# Patient Record
Sex: Female | Born: 1975
Health system: Southern US, Community
[De-identification: ages and names within clinical notes are randomized; demographics above are authoritative.]

## PROBLEM LIST (undated history)

## (undated) DIAGNOSIS — E119 Type 2 diabetes mellitus without complications: Secondary | ICD-10-CM

## (undated) DIAGNOSIS — F419 Anxiety disorder, unspecified: Secondary | ICD-10-CM

## (undated) DIAGNOSIS — F32A Depression, unspecified: Secondary | ICD-10-CM

## (undated) DIAGNOSIS — I10 Essential (primary) hypertension: Secondary | ICD-10-CM

## (undated) DIAGNOSIS — T7840XA Allergy, unspecified, initial encounter: Secondary | ICD-10-CM

## (undated) HISTORY — DX: Anxiety disorder, unspecified: F41.9

## (undated) HISTORY — DX: Essential (primary) hypertension: I10

## (undated) HISTORY — DX: Depression, unspecified: F32.A

## (undated) HISTORY — DX: Type 2 diabetes mellitus without complications: E11.9

## (undated) HISTORY — DX: Allergy, unspecified, initial encounter: T78.40XA

---

## 1998-01-23 ENCOUNTER — Other Ambulatory Visit: Admission: RE | Admit: 1998-01-23 | Discharge: 1998-01-23 | Payer: Self-pay | Admitting: Obstetrics and Gynecology

## 1999-02-16 ENCOUNTER — Other Ambulatory Visit: Admission: RE | Admit: 1999-02-16 | Discharge: 1999-02-16 | Payer: Self-pay | Admitting: *Deleted

## 2004-08-07 ENCOUNTER — Other Ambulatory Visit: Admission: RE | Admit: 2004-08-07 | Discharge: 2004-08-07 | Payer: Self-pay | Admitting: *Deleted

## 2005-11-22 ENCOUNTER — Other Ambulatory Visit: Admission: RE | Admit: 2005-11-22 | Discharge: 2005-11-22 | Payer: Self-pay | Admitting: Gynecology

## 2007-02-18 ENCOUNTER — Other Ambulatory Visit: Admission: RE | Admit: 2007-02-18 | Discharge: 2007-02-18 | Payer: Self-pay | Admitting: Family Medicine

## 2008-08-19 ENCOUNTER — Other Ambulatory Visit: Admission: RE | Admit: 2008-08-19 | Discharge: 2008-08-19 | Payer: Self-pay | Admitting: Gynecology

## 2008-08-19 ENCOUNTER — Encounter: Payer: Self-pay | Admitting: Women's Health

## 2008-08-19 ENCOUNTER — Ambulatory Visit: Payer: Self-pay | Admitting: Women's Health

## 2008-10-04 ENCOUNTER — Ambulatory Visit: Payer: Self-pay | Admitting: Gynecology

## 2008-10-04 ENCOUNTER — Encounter: Payer: Self-pay | Admitting: Gynecology

## 2009-02-02 ENCOUNTER — Ambulatory Visit: Payer: Self-pay | Admitting: Women's Health

## 2009-03-20 ENCOUNTER — Ambulatory Visit: Payer: Self-pay | Admitting: Gynecology

## 2009-03-20 ENCOUNTER — Other Ambulatory Visit: Admission: RE | Admit: 2009-03-20 | Discharge: 2009-03-20 | Payer: Self-pay | Admitting: Gynecology

## 2009-03-30 ENCOUNTER — Ambulatory Visit (HOSPITAL_COMMUNITY): Admission: RE | Admit: 2009-03-30 | Discharge: 2009-03-30 | Payer: Self-pay | Admitting: Gynecology

## 2009-04-04 ENCOUNTER — Ambulatory Visit: Payer: Self-pay | Admitting: Gynecology

## 2009-04-28 ENCOUNTER — Ambulatory Visit: Payer: Self-pay | Admitting: Gynecology

## 2009-05-04 ENCOUNTER — Ambulatory Visit: Payer: Self-pay | Admitting: Gynecology

## 2009-05-11 ENCOUNTER — Ambulatory Visit: Payer: Self-pay | Admitting: Gynecology

## 2009-10-18 ENCOUNTER — Encounter: Admission: RE | Admit: 2009-10-18 | Discharge: 2009-10-18 | Payer: Self-pay | Admitting: Obstetrics and Gynecology

## 2009-11-29 ENCOUNTER — Ambulatory Visit: Payer: Self-pay | Admitting: Obstetrics and Gynecology

## 2009-11-29 ENCOUNTER — Inpatient Hospital Stay (HOSPITAL_COMMUNITY): Admission: AD | Admit: 2009-11-29 | Discharge: 2009-11-29 | Payer: Self-pay | Admitting: Obstetrics and Gynecology

## 2009-12-27 ENCOUNTER — Inpatient Hospital Stay (HOSPITAL_COMMUNITY): Admission: RE | Admit: 2009-12-27 | Discharge: 2009-12-30 | Payer: Self-pay | Admitting: Obstetrics & Gynecology

## 2009-12-27 ENCOUNTER — Encounter (INDEPENDENT_AMBULATORY_CARE_PROVIDER_SITE_OTHER): Payer: Self-pay | Admitting: Obstetrics and Gynecology

## 2010-04-22 ENCOUNTER — Encounter: Payer: Self-pay | Admitting: Gastroenterology

## 2010-06-14 LAB — BASIC METABOLIC PANEL WITH GFR
BUN: 5 mg/dL — ABNORMAL LOW (ref 6–23)
CO2: 21 meq/L (ref 19–32)
Calcium: 8.9 mg/dL (ref 8.4–10.5)
Chloride: 107 meq/L (ref 96–112)
Creatinine, Ser: 0.55 mg/dL (ref 0.4–1.2)
GFR calc non Af Amer: >60 mL/min
Glucose, Bld: 105 mg/dL — ABNORMAL HIGH (ref 70–99)
Potassium: 3.7 meq/L (ref 3.5–5.1)
Sodium: 136 meq/L (ref 135–145)

## 2010-06-14 LAB — GLUCOSE, CAPILLARY
Glucose-Capillary: 116 mg/dL — ABNORMAL HIGH (ref 70–99)
Glucose-Capillary: 93 mg/dL (ref 70–99)

## 2010-06-14 LAB — CBC
Hemoglobin: 10.1 g/dL — ABNORMAL LOW (ref 12.0–15.0)
Hemoglobin: 12.5 g/dL (ref 12.0–15.0)
MCH: 30.3 pg (ref 26.0–34.0)
MCHC: 34.8 g/dL (ref 30.0–36.0)
Platelets: 179 10*3/uL (ref 150–400)
Platelets: 217 10*3/uL (ref 150–400)
RBC: 3.23 MIL/uL — ABNORMAL LOW (ref 3.87–5.11)
RBC: 4.13 MIL/uL (ref 3.87–5.11)
RDW: 14.6 % (ref 11.5–15.5)

## 2010-06-14 LAB — SURGICAL PCR SCREEN: Staphylococcus aureus: NEGATIVE

## 2010-07-20 LAB — HM HEPATITIS C SCREENING LAB: HM Hepatitis Screen: NEGATIVE

## 2010-07-20 LAB — HM HIV SCREENING LAB: HM HIV Screening: NEGATIVE

## 2012-03-25 IMAGING — US US OB LIMITED
1 series · 14 of 22 positions shown · non-contrast
Comparison: none

OBSTETRICAL ULTRASOUND:
 This ultrasound exam was performed in the [HOSPITAL] Ultrasound Department.  The OB US report was generated in the AS system, and faxed to the ordering physician.  This report is also available in [HOSPITAL]?s AccessANYware and in [REDACTED] PACS.

[Series 1: us fetal bpp w/o nonstress · non-contrast · 22 acquisitions, 14 frames shown]
[im 1/22]
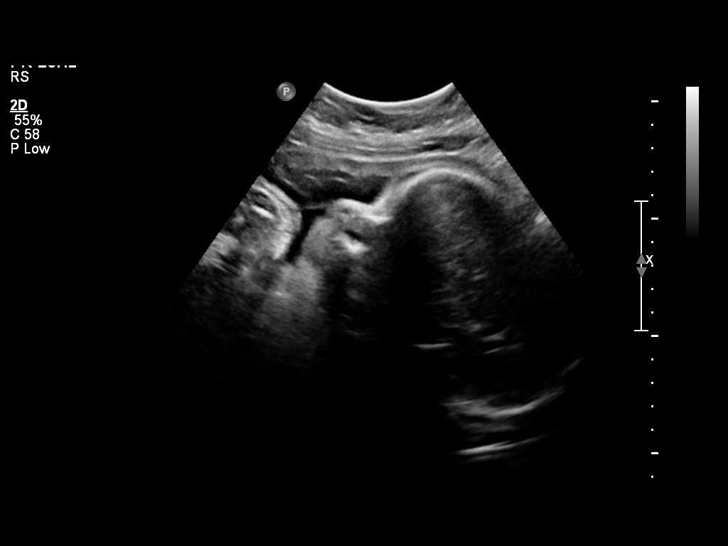
[im 3/22]
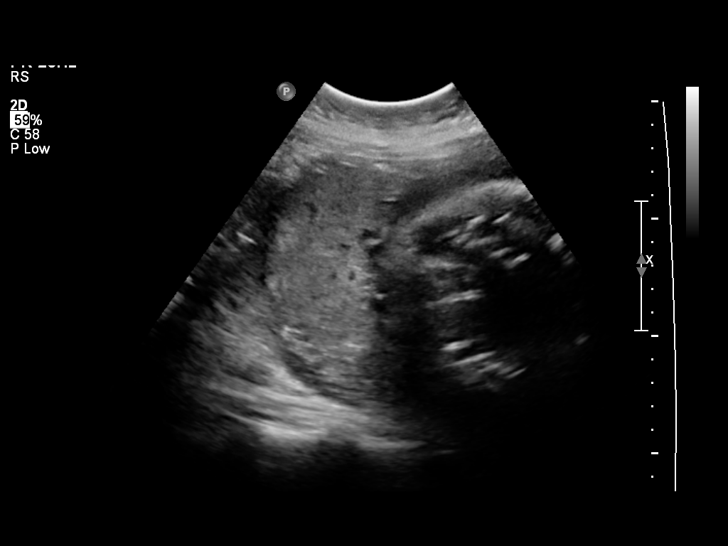
[im 4/22]
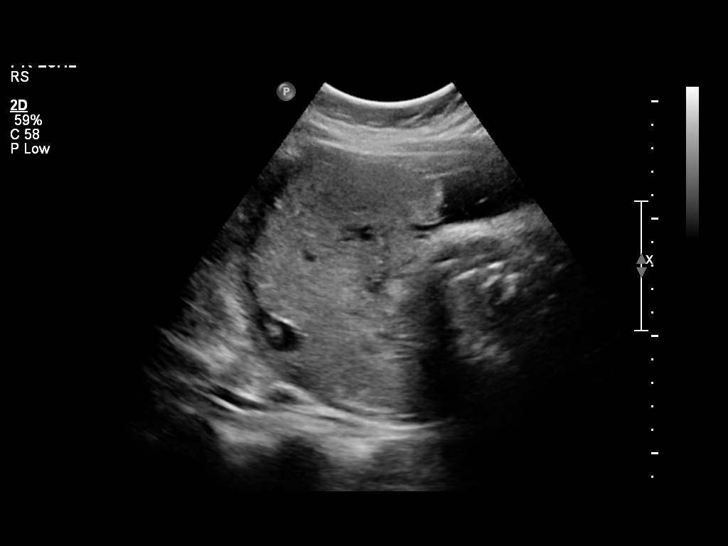
[im 6/22]
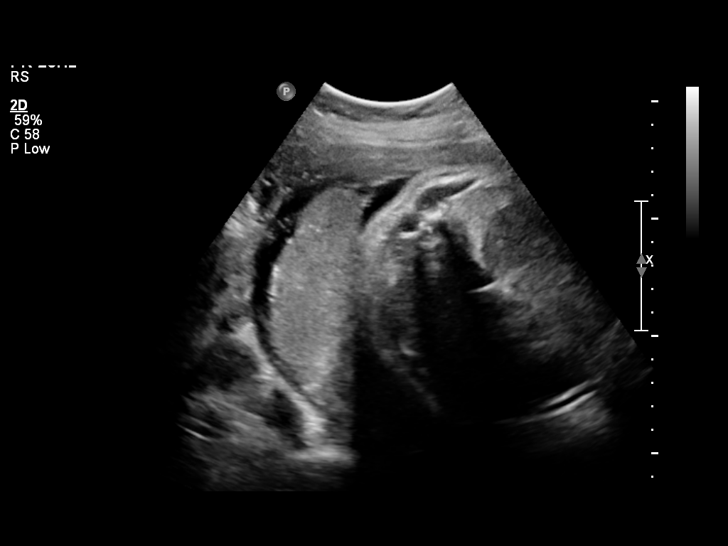
[im 8/22]
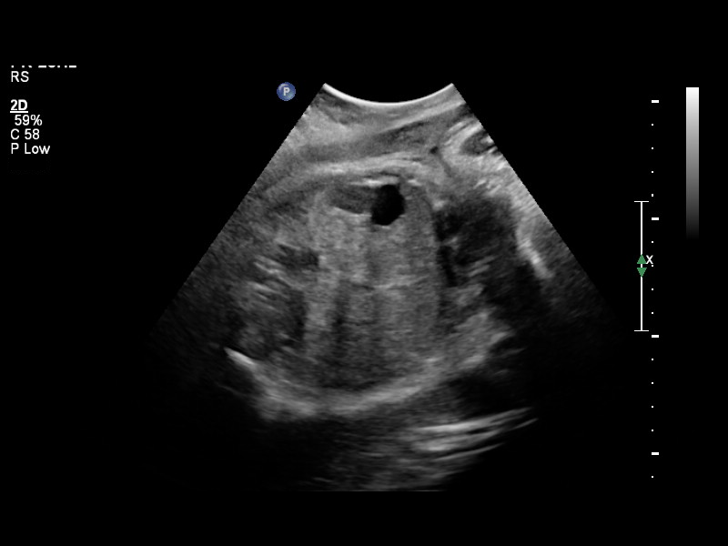
[im 9/22]
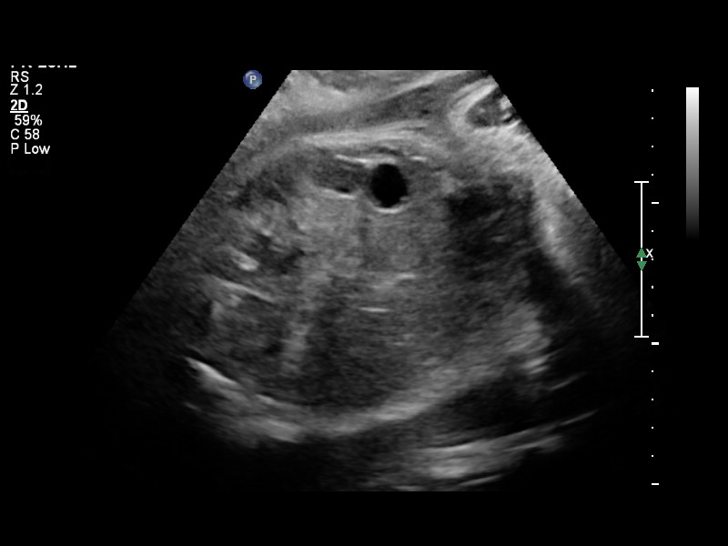
[im 11/22]
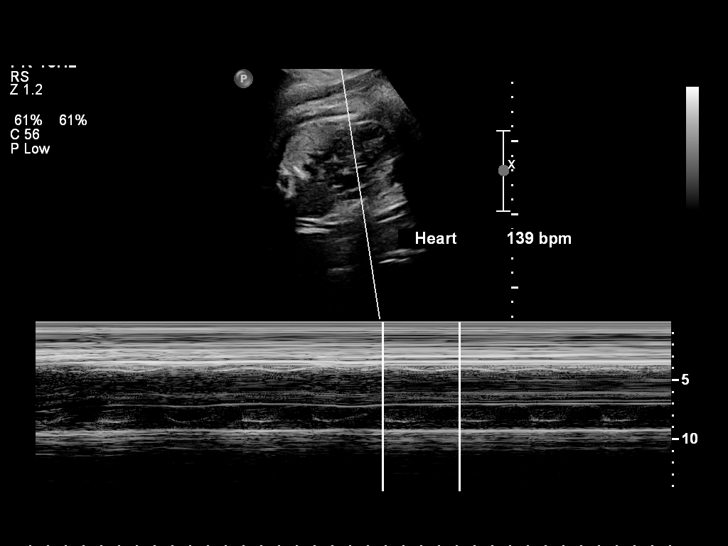
[im 12/22]
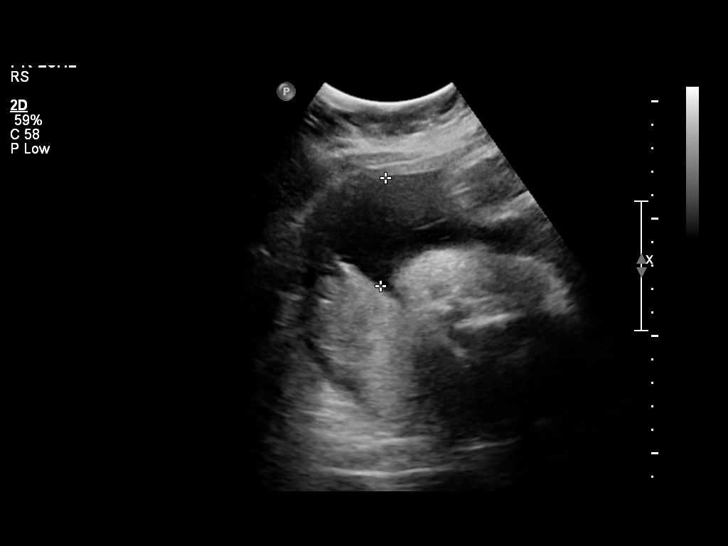
[im 14/22]
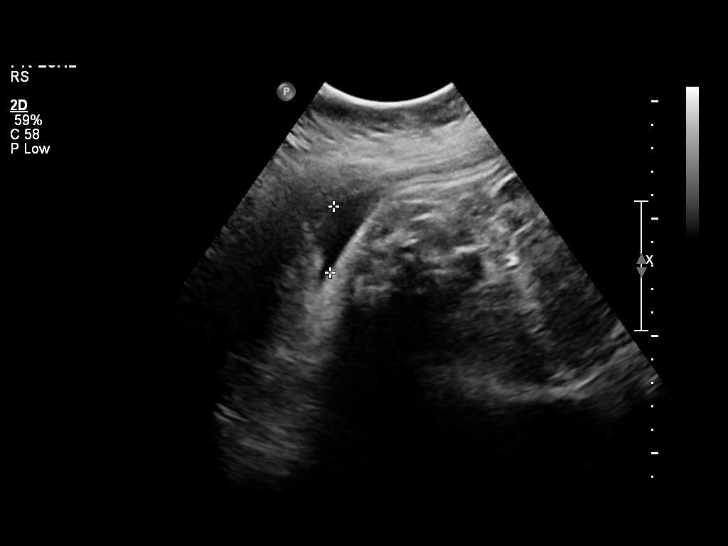
[im 15/22]
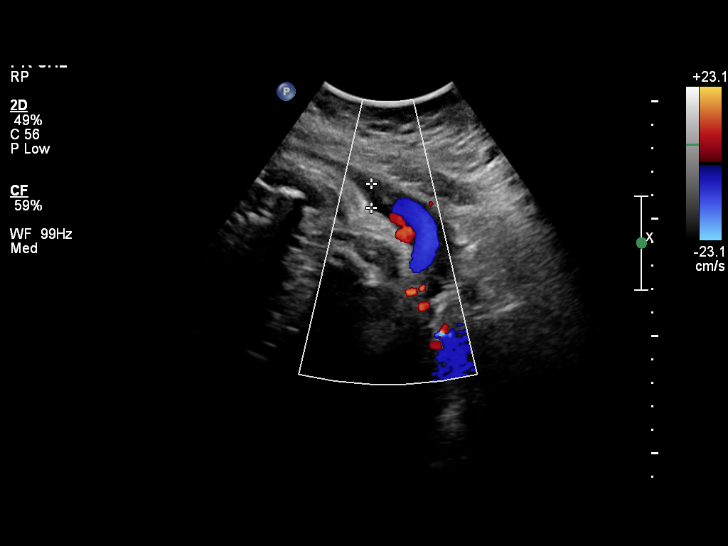
[im 17/22]
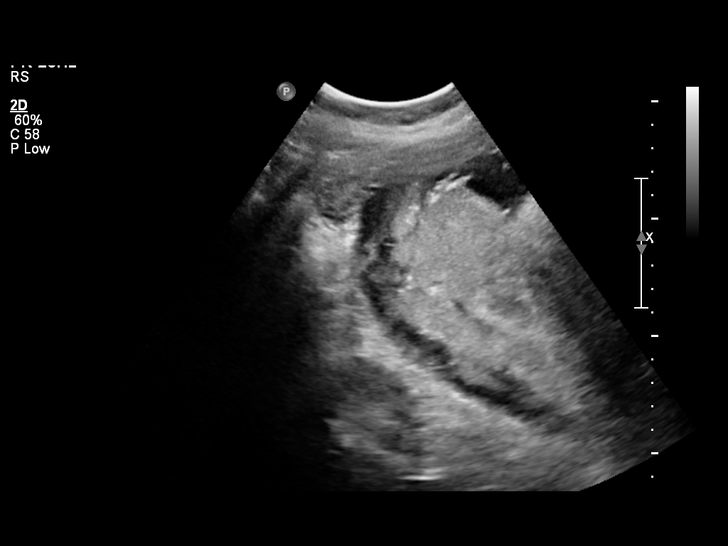
[im 19/22]
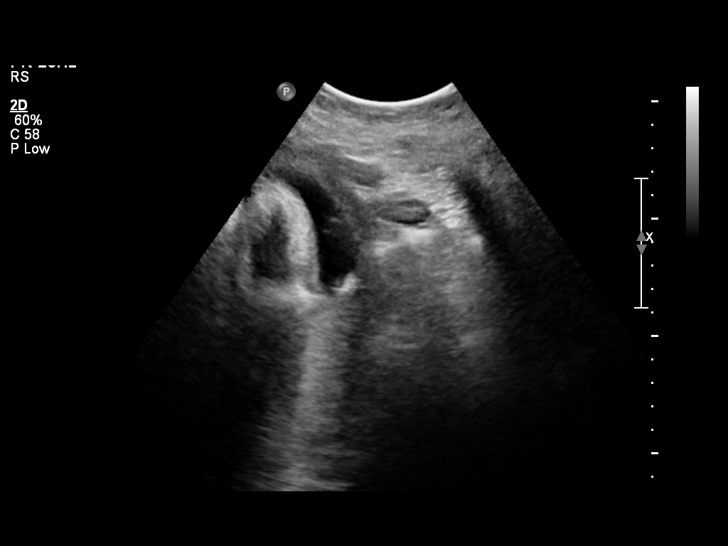
[im 20/22]
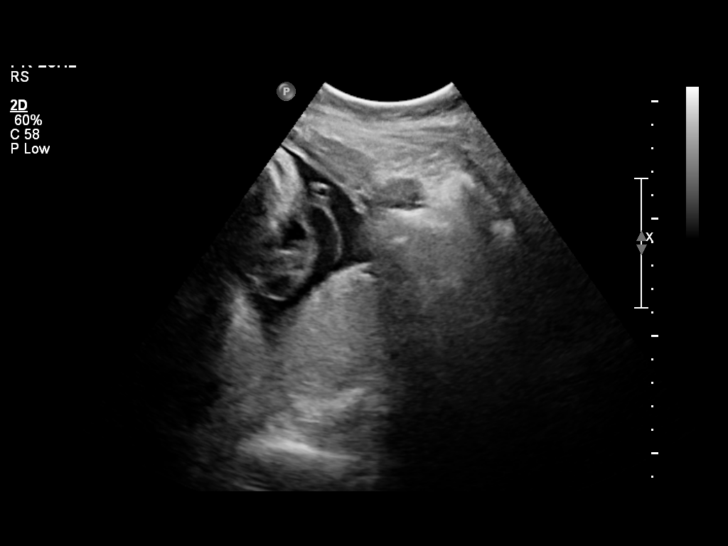
[im 22/22]
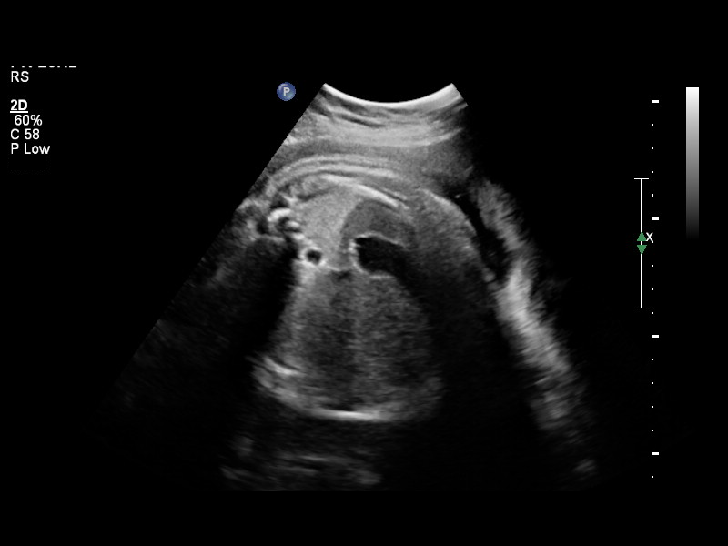

[14 of 22 positions shown; findings below may reference images not displayed]

IMPRESSION: See AS Obstetric US report.

## 2013-08-06 ENCOUNTER — Other Ambulatory Visit (HOSPITAL_COMMUNITY)
Admission: RE | Admit: 2013-08-06 | Discharge: 2013-08-06 | Disposition: A | Discharge status: Home / Self Care | Payer: 59 | Source: Ambulatory Visit | Attending: Family Medicine | Admitting: Family Medicine

## 2013-08-06 ENCOUNTER — Other Ambulatory Visit: Payer: Self-pay | Admitting: Family Medicine

## 2013-08-06 DIAGNOSIS — Z124 Encounter for screening for malignant neoplasm of cervix: Secondary | ICD-10-CM | POA: Insufficient documentation

## 2018-03-12 ENCOUNTER — Other Ambulatory Visit (HOSPITAL_COMMUNITY)
Admission: RE | Admit: 2018-03-12 | Discharge: 2018-03-12 | Disposition: A | Discharge status: Home / Self Care | Payer: BLUE CROSS/BLUE SHIELD | Source: Ambulatory Visit | Attending: Family Medicine | Admitting: Family Medicine

## 2018-03-12 ENCOUNTER — Other Ambulatory Visit: Payer: Self-pay | Admitting: Family Medicine

## 2018-03-12 DIAGNOSIS — N930 Postcoital and contact bleeding: Secondary | ICD-10-CM | POA: Diagnosis not present

## 2018-03-12 DIAGNOSIS — F329 Major depressive disorder, single episode, unspecified: Secondary | ICD-10-CM | POA: Diagnosis not present

## 2018-03-12 DIAGNOSIS — Z124 Encounter for screening for malignant neoplasm of cervix: Secondary | ICD-10-CM | POA: Diagnosis not present

## 2018-03-17 LAB — CYTOLOGY - PAP
Diagnosis: NEGATIVE
HPV (WINDOPATH): NOT DETECTED

## 2019-05-27 ENCOUNTER — Ambulatory Visit: Payer: PRIVATE HEALTH INSURANCE | Attending: Family

## 2019-05-27 DIAGNOSIS — Z23 Encounter for immunization: Secondary | ICD-10-CM | POA: Insufficient documentation

## 2019-05-27 NOTE — Progress Notes (Signed)
   Covid-19 Vaccination Clinic  Name:  Jessica Li    MRN: [id number] DOB: January 02, 1976  05/27/2019  [name] was observed post Covid-19 immunization for 15 minutes without incidence. She was provided with Vaccine Information Sheet and instruction to access the V-Safe system.   [name] was instructed to call 911 with any severe reactions post vaccine: Marland Kitchen Difficulty breathing  . Swelling of your face and throat  . A fast heartbeat  . A bad rash all over your body  . Dizziness and weakness    Immunizations Administered    Name Date Dose VIS Date Route   Moderna COVID-19 Vaccine 05/27/2019  1:20 PM 0.5 mL 03/02/2019 Intramuscular   Manufacturer: Moderna   Lot: [id number]   NDC: 48688-520-74

## 2019-06-29 ENCOUNTER — Ambulatory Visit: Payer: PRIVATE HEALTH INSURANCE | Attending: Family

## 2019-06-29 DIAGNOSIS — Z23 Encounter for immunization: Secondary | ICD-10-CM

## 2019-06-29 NOTE — Progress Notes (Signed)
   Covid-19 Vaccination Clinic  Name:  Jessica Li    MRN: [id number] DOB: [birth year]  06/29/2019  [name] was observed post Covid-19 immunization for 15 minutes without incident. She was provided with Vaccine Information Sheet and instruction to access the V-Safe system.   [name] was instructed to call 911 with any severe reactions post vaccine: Marland Kitchen Difficulty breathing  . Swelling of face and throat  . A fast heartbeat  . A bad rash all over body  . Dizziness and weakness   Immunizations Administered    Name Date Dose VIS Date Route   Moderna COVID-19 Vaccine 06/29/2019  4:22 PM 0.5 mL 03/02/2019 Intramuscular   Manufacturer: Moderna   Lot: [id number]   NDC: 16384-665-99

## 2020-05-24 DIAGNOSIS — Z1231 Encounter for screening mammogram for malignant neoplasm of breast: Secondary | ICD-10-CM | POA: Diagnosis not present

## 2020-06-13 DIAGNOSIS — R928 Other abnormal and inconclusive findings on diagnostic imaging of breast: Secondary | ICD-10-CM | POA: Diagnosis not present

## 2020-06-13 DIAGNOSIS — N6489 Other specified disorders of breast: Secondary | ICD-10-CM | POA: Diagnosis not present

## 2020-06-13 DIAGNOSIS — R922 Inconclusive mammogram: Secondary | ICD-10-CM | POA: Diagnosis not present

## 2021-01-18 DIAGNOSIS — R6883 Chills (without fever): Secondary | ICD-10-CM | POA: Diagnosis not present

## 2021-01-18 DIAGNOSIS — R051 Acute cough: Secondary | ICD-10-CM | POA: Diagnosis not present

## 2021-01-18 DIAGNOSIS — J069 Acute upper respiratory infection, unspecified: Secondary | ICD-10-CM | POA: Diagnosis not present

## 2021-01-18 DIAGNOSIS — M791 Myalgia, unspecified site: Secondary | ICD-10-CM | POA: Diagnosis not present

## 2021-05-30 DIAGNOSIS — Z1231 Encounter for screening mammogram for malignant neoplasm of breast: Secondary | ICD-10-CM | POA: Diagnosis not present

## 2021-07-17 NOTE — Progress Notes (Signed)
? ?New Patient Office Visit ? ?Subjective   ? ?Patient ID: Jessica Li, female    DOB: [birth year]  Age: 46 y.o. MRN: [id number] ? ?CC:  ?Chief Complaint  ?Patient presents with  ? Establish Care  ?  Np. Est care. DM management. Pt is fasting.   ? ? ?HPI ?Jessica Li presents for new patient visit to establish care.  Introduced to Publishing rights manager role and practice setting.  All questions answered.  Discussed provider/patient relationship and expectations. ? ?She was seeing a provider in [place] who saw patients without insurance. She now has health insurance and would like to establish closer.  ? ?She feels like she has had fluid in her left ear for several years. Was told that she could have tubes placed in her ear if her symptoms were continuing. She would like a referral to ENT. She endorses some congestion right now and seasonal allergies.  ? ?She has a history of diabetes. She has been close to running out of her medications so she has been stretching them for the last 3 months. She has not been checking her blood sugars recently as she knows they will be high. She denies chest pain, shortness of breath, blurry vision, and numbness or tingling in her feet.  ? ?She has a history of anxiety. She was on wellbutrin and prozac in the past, however she wanted to get off the medication. She has not had panic attacks in the last 10 years. She does not feel like it interferes with every day life. She gets irritated easily, even with small things. She denies trouble sleeping. She is not interested in starting medication today if she doesn't have to.  ? ? ?  07/19/2021  ?  9:00 AM  ?Depression screen PHQ 2/9  ?Decreased Interest 1  ?Down, Depressed, Hopeless 1  ?PHQ - 2 Score 2  ?Altered sleeping 1  ?Tired, decreased energy 2  ?Change in appetite 2  ?Feeling bad or failure about yourself  0  ?Trouble concentrating 2  ?Moving slowly or fidgety/restless 0  ?Suicidal thoughts 0  ?PHQ-9 Score 9  ?Difficult doing  work/chores Not difficult at all  ? ? ?  07/19/2021  ?  9:00 AM  ?GAD 7 : Generalized Anxiety Score  ?Nervous, Anxious, on Edge 3  ?Control/stop worrying 3  ?Worry too much - different things 3  ?Trouble relaxing 1  ?Restless 0  ?Easily annoyed or irritable 3  ?Afraid - awful might happen 0  ?Total GAD 7 Score 13  ?Anxiety Difficulty Not difficult at all  ? ? ?Outpatient Encounter Medications as of 07/19/2021  ?Medication Sig  ? glimepiride (AMARYL) 4 MG tablet Take 1 tablet (4 mg total) by mouth 2 (two) times daily.  ? lisinopril (ZESTRIL) 10 MG tablet Take 1 tablet (10 mg total) by mouth daily.  ? metFORMIN (GLUCOPHAGE) 1000 MG tablet Take 1 tablet (1,000 mg total) by mouth 2 (two) times daily with a meal.  ? pioglitazone (ACTOS) 15 MG tablet Take 1 tablet (15 mg total) by mouth daily.  ? [DISCONTINUED] glimepiride (AMARYL) 4 MG tablet Take 4 mg by mouth 2 (two) times daily.  ? [DISCONTINUED] lisinopril (ZESTRIL) 10 MG tablet Take 10 mg by mouth daily.  ? [DISCONTINUED] metFORMIN (GLUCOPHAGE) 1000 MG tablet Take 500 mg by mouth in the morning and at bedtime.  ? [DISCONTINUED] pioglitazone (ACTOS) 15 MG tablet take 1 pill daily  ? ?No facility-administered encounter medications on file as of 07/19/2021.  ? ? ?  Past Medical History:  ?Diagnosis Date  ? Diabetes mellitus without complication (HCC)   ? Hypertension   ? ? ?Past Surgical History:  ?Procedure Laterality Date  ? CESAREAN SECTION    ? ? ?Family History  ?Problem Relation Age of Onset  ? Heart disease Mother   ? Heart disease Father   ? Cancer Sister   ?     melanoma  ? ? ?Social History  ? ?Socioeconomic History  ? Marital status: Married  ?  Spouse name: Not on file  ? Number of children: Not on file  ? Years of education: Not on file  ? Highest education level: Not on file  ?Occupational History  ? Not on file  ?Tobacco Use  ? Smoking status: Never  ? Smokeless tobacco: Never  ?Vaping Use  ? Vaping Use: Never used  ?Substance and Sexual Activity  ? Alcohol  use: Never  ? Drug use: Never  ? Sexual activity: Yes  ?  Birth control/protection: None  ?Other Topics Concern  ? Not on file  ?Social History Narrative  ? Not on file  ? ?Social Determinants of Health  ? ?Financial Resource Strain: Not on file  ?Food Insecurity: Not on file  ?Transportation Needs: Not on file  ?Physical Activity: Not on file  ?Stress: Not on file  ?Social Connections: Not on file  ?Intimate Partner Violence: Not on file  ? ? ?Review of Systems  ?Constitutional: Negative.   ?HENT:  Positive for congestion and ear pain (left ear fluid). Negative for sore throat.   ?Eyes: Negative.   ?Respiratory: Negative.    ?Cardiovascular:  Positive for palpitations (with anxiety). Negative for chest pain.  ?Gastrointestinal:  Positive for constipation (takes miralax as needed). Negative for abdominal pain, diarrhea, nausea and vomiting.  ?Genitourinary: Negative.   ?Musculoskeletal: Negative.   ?Skin: Negative.   ?Neurological:  Positive for headaches (once in a while). Negative for dizziness.  ?Endo/Heme/Allergies:  Positive for environmental allergies.  ?Psychiatric/Behavioral:  Negative for depression. The patient is nervous/anxious.   ?  ?Objective   ? ?BP 136/88 (BP Location: Right Arm, Cuff Size: Large)   Pulse (!) 104   Temp 97.9 ?F (36.6 ?C) (Temporal)   Ht 5' 3.5" (1.613 m)   Wt 179 lb 9.6 oz (81.5 kg)   LMP 06/11/2021 (Exact Date)   SpO2 99%   BMI 31.32 kg/m?  ? ?Physical Exam ?Vitals and nursing note reviewed.  ?Constitutional:   ?   General: She is not in acute distress. ?   Appearance: Normal appearance.  ?HENT:  ?   Head: Normocephalic and atraumatic.  ?   Right Ear: Tympanic membrane, ear canal and external ear normal.  ?   Left Ear: External ear normal. There is impacted cerumen.  ?   Nose: Nose normal.  ?   Mouth/Throat:  ?   Mouth: Mucous membranes are moist.  ?   Pharynx: Oropharynx is clear.  ?Eyes:  ?   Conjunctiva/sclera: Conjunctivae normal.  ?Cardiovascular:  ?   Rate and Rhythm:  Normal rate and regular rhythm.  ?   Pulses: Normal pulses.  ?   Heart sounds: Normal heart sounds.  ?Pulmonary:  ?   Effort: Pulmonary effort is normal.  ?   Breath sounds: Normal breath sounds.  ?Abdominal:  ?   General: Bowel sounds are normal.  ?   Palpations: Abdomen is soft.  ?   Tenderness: There is no abdominal tenderness.  ?Musculoskeletal:     ?  General: Normal range of motion.  ?   Cervical back: Normal range of motion. No tenderness.  ?   Right lower leg: No edema.  ?   Left lower leg: No edema.  ?Lymphadenopathy:  ?   Cervical: No cervical adenopathy.  ?Skin: ?   General: Skin is warm and dry.  ?Neurological:  ?   General: No focal deficit present.  ?   Mental Status: She is alert and oriented to person, place, and time.  ?   Cranial Nerves: No cranial nerve deficit.  ?   Coordination: Coordination normal.  ?   Gait: Gait normal.  ?Psychiatric:     ?   Mood and Affect: Mood normal.     ?   Behavior: Behavior normal.     ?   Thought Content: Thought content normal.     ?   Judgment: Judgment normal.  ? ? ?Last CBC ?Lab Results  ?Component Value Date  ? WBC 10.5 12/28/2009  ? HGB 10.1 DELTA CHECK NOTED REPEATED TO VERIFY (L) 12/28/2009  ? HCT 28.9 (L) 12/28/2009  ? MCV 89.4 12/28/2009  ? MCH 31.1 12/28/2009  ? RDW 14.4 12/28/2009  ? PLT 179 12/28/2009  ? ?Last metabolic panel ?Lab Results  ?Component Value Date  ? GLUCOSE 105 (H) 12/26/2009  ? NA 136 12/26/2009  ? K 3.7 12/26/2009  ? CL 107 12/26/2009  ? CO2 21 12/26/2009  ? BUN 5 (L) 12/26/2009  ? CREATININE 0.55 12/26/2009  ? CALCIUM 8.9 12/26/2009  ? ?  ? ?Assessment & Plan:  ? ?Problem List Items Addressed This Visit   ? ?  ? Cardiovascular and Mediastinum  ? Hypertension associated with diabetes (HCC)  ?  Chronic, elevated.  BP recheck on today was 136/88.  She has been running low on her lisinopril, will refill lisinopril 10 mg daily.  Check CMP, CBC, lipid panel.  Follow-up in 3 months. ? ?  ?  ? Relevant Medications  ? glimepiride (AMARYL) 4 MG  tablet  ? lisinopril (ZESTRIL) 10 MG tablet  ? metFORMIN (GLUCOPHAGE) 1000 MG tablet  ? pioglitazone (ACTOS) 15 MG tablet  ? Other Relevant Orders  ? CBC with Differential/Platelet  ? Comprehensive metabolic panel

## 2021-07-19 ENCOUNTER — Encounter: Payer: Self-pay | Admitting: Nurse Practitioner

## 2021-07-19 ENCOUNTER — Ambulatory Visit: Payer: BC Managed Care – PPO | Admitting: Nurse Practitioner

## 2021-07-19 VITALS — BP 136/88 | HR 104 | Temp 97.9°F | Ht 63.5 in | Wt 179.6 lb

## 2021-07-19 DIAGNOSIS — F419 Anxiety disorder, unspecified: Secondary | ICD-10-CM | POA: Diagnosis not present

## 2021-07-19 DIAGNOSIS — Z23 Encounter for immunization: Secondary | ICD-10-CM | POA: Diagnosis not present

## 2021-07-19 DIAGNOSIS — H9202 Otalgia, left ear: Secondary | ICD-10-CM | POA: Insufficient documentation

## 2021-07-19 DIAGNOSIS — I152 Hypertension secondary to endocrine disorders: Secondary | ICD-10-CM | POA: Diagnosis not present

## 2021-07-19 DIAGNOSIS — E1159 Type 2 diabetes mellitus with other circulatory complications: Secondary | ICD-10-CM | POA: Diagnosis not present

## 2021-07-19 DIAGNOSIS — E119 Type 2 diabetes mellitus without complications: Secondary | ICD-10-CM

## 2021-07-19 LAB — MICROALBUMIN / CREATININE URINE RATIO
Creatinine,U: 28.5 mg/dL
Microalb Creat Ratio: 3.4 mg/g (ref 0.0–30.0)
Microalb, Ur: 1 mg/dL (ref 0.0–1.9)

## 2021-07-19 LAB — CBC WITH DIFFERENTIAL/PLATELET
Basophils Absolute: 0 10*3/uL (ref 0.0–0.1)
Basophils Relative: 0.5 % (ref 0.0–3.0)
Eosinophils Absolute: 0.1 10*3/uL (ref 0.0–0.7)
Eosinophils Relative: 1.6 % (ref 0.0–5.0)
HCT: 33.8 % — ABNORMAL LOW (ref 36.0–46.0)
Hemoglobin: 10.7 g/dL — ABNORMAL LOW (ref 12.0–15.0)
Lymphocytes Relative: 22.9 % (ref 12.0–46.0)
Lymphs Abs: 1.6 10*3/uL (ref 0.7–4.0)
MCHC: 31.6 g/dL (ref 30.0–36.0)
MCV: 72.1 fl — ABNORMAL LOW (ref 78.0–100.0)
Monocytes Absolute: 0.5 10*3/uL (ref 0.1–1.0)
Monocytes Relative: 7 % (ref 3.0–12.0)
Neutro Abs: 4.7 10*3/uL (ref 1.4–7.7)
Neutrophils Relative %: 68 % (ref 43.0–77.0)
Platelets: 522 10*3/uL — ABNORMAL HIGH (ref 150.0–400.0)
RBC: 4.69 Mil/uL (ref 3.87–5.11)
RDW: 16.7 % — ABNORMAL HIGH (ref 11.5–15.5)
WBC: 7 10*3/uL (ref 4.0–10.5)

## 2021-07-19 LAB — LIPID PANEL
Cholesterol: 262 mg/dL — ABNORMAL HIGH (ref 0–200)
HDL: 55.4 mg/dL (ref >39.00)
NonHDL: 206.67
Total CHOL/HDL Ratio: 5
Triglycerides: 238 mg/dL — ABNORMAL HIGH (ref 0.0–149.0)
VLDL: 47.6 mg/dL — ABNORMAL HIGH (ref 0.0–40.0)

## 2021-07-19 LAB — TSH: TSH: 2.15 u[IU]/mL (ref 0.35–5.50)

## 2021-07-19 LAB — COMPREHENSIVE METABOLIC PANEL
ALT: 13 U/L (ref 0–35)
AST: 13 U/L (ref 0–37)
Albumin: 4 g/dL (ref 3.5–5.2)
Alkaline Phosphatase: 80 U/L (ref 39–117)
BUN: 12 mg/dL (ref 6–23)
CO2: 25 mEq/L (ref 19–32)
Calcium: 9.2 mg/dL (ref 8.4–10.5)
Chloride: 96 mEq/L (ref 96–112)
Creatinine, Ser: 0.59 mg/dL (ref 0.40–1.20)
GFR: 108.18 mL/min (ref >60.00)
Glucose, Bld: 375 mg/dL — ABNORMAL HIGH (ref 70–99)
Potassium: 4.5 mEq/L (ref 3.5–5.1)
Sodium: 131 mEq/L — ABNORMAL LOW (ref 135–145)
Total Bilirubin: 0.4 mg/dL (ref 0.2–1.2)
Total Protein: 7.4 g/dL (ref 6.0–8.3)

## 2021-07-19 LAB — HEMOGLOBIN A1C: Hgb A1c MFr Bld: 13.9 % — ABNORMAL HIGH (ref 4.6–6.5)

## 2021-07-19 LAB — LDL CHOLESTEROL, DIRECT: Direct LDL: 188 mg/dL

## 2021-07-19 MED ORDER — GLIMEPIRIDE 4 MG PO TABS: 4.0000 mg | ORAL_TABLET | Freq: Two times a day (BID) | ORAL | 1 refills | Status: DC

## 2021-07-19 MED ORDER — METFORMIN HCL 1000 MG PO TABS: 1000.0000 mg | ORAL_TABLET | Freq: Two times a day (BID) | ORAL | 1 refills | Status: DC

## 2021-07-19 MED ORDER — LISINOPRIL 10 MG PO TABS: 10.0000 mg | ORAL_TABLET | Freq: Every day | ORAL | 1 refills | Status: DC

## 2021-07-19 MED ORDER — PIOGLITAZONE HCL 15 MG PO TABS: 15.0000 mg | ORAL_TABLET | Freq: Every day | ORAL | 1 refills | Status: DC

## 2021-07-19 NOTE — Assessment & Plan Note (Signed)
She has a history of diabetes, she has been running low on her medications and has been stretching them out for the last 3 months.  She stopped checking her blood sugars because she knew that they would be elevated.  Will restart glimepiride 4 mg twice a day, lisinopril 10 mg daily, metformin 2000 mg twice a day, and Actos 15 mg daily.  We will check A1c, CMP, CBC, urine microalbumin.  Foot exam within normal limits.  Follow-up in 3 months. ?

## 2021-07-19 NOTE — Assessment & Plan Note (Deleted)
She has a history of diabetes, she has been running low on her medications and has been stretching them out for the last 3 months.  She stopped checking her blood sugars because she knew that they would be elevated.  Will restart glimepiride 4 mg twice a day, lisinopril 10 mg daily, metformin 2000 mg twice a day, and Actos 15 mg daily.  We will check A1c, CMP, CBC, urine microalbumin.  Foot exam within normal limits.  Follow-up in 3 months. ?

## 2021-07-19 NOTE — Assessment & Plan Note (Signed)
She has been having ongoing discomfort in her left ear for several years.  She states that she was told that she could get tubes put in her ear, however she declined this.  Today she does have some earwax in her left ear, she declines irrigation today.  She can use Debrox eardrops over-the-counter to help with earwax removal.  Referral placed to ENT per patient request ?

## 2021-07-19 NOTE — Assessment & Plan Note (Signed)
Chronic, elevated.  BP recheck on today was 136/88.  She has been running low on her lisinopril, will refill lisinopril 10 mg daily.  Check CMP, CBC, lipid panel.  Follow-up in 3 months. ?

## 2021-07-19 NOTE — Patient Instructions (Signed)
It was great to see you! ? ?We are checking your labs today and will let you know the results via mychart/phone.  ? ?I have sent refills to your pharmacy.  ? ?Let's follow-up in 3 months, sooner if you have concerns. ? ?If a referral was placed today, you will be contacted for an appointment. Please note that routine referrals can sometimes take up to 3-4 weeks to process. Please call our office if you haven't heard anything after this time frame. ? ?Take care, ? ?Alastor Kneale, NP ? ?

## 2021-07-19 NOTE — Telephone Encounter (Signed)
Responded in other mychart message

## 2021-07-19 NOTE — Assessment & Plan Note (Signed)
Chronic, stable.  She states that she was on medication in the past including Wellbutrin and Prozac.  She is not interested in restarting medication today.  Her PHQ-9 is a 9 and her GAD-7 is a 13, however she states that her symptoms are overall stable.  Discussed nonpharmacologic ways to help with anxiety including exercise, meditation, deep breathing.  Also discussed the option of therapy.  Follow-up in 6 months or sooner with concerns. ?

## 2021-10-19 ENCOUNTER — Ambulatory Visit: Payer: BC Managed Care – PPO | Admitting: Nurse Practitioner

## 2022-01-01 NOTE — Progress Notes (Unsigned)
   Established Patient Office Visit  Subjective   Patient ID: Jessica Li, female    DOB: 01-May-1975  Age: 46 y.o. MRN: [id number]  No chief complaint on file.   HPI  KEARIA YIN is here to follow-up on diabetes and hyperlipidemia.   DIABETES  Hypoglycemic episodes:{Blank single:19197::"yes","no"} Polydipsia/polyuria: {Blank single:19197::"yes","no"} Visual disturbance: {Blank single:19197::"yes","no"} Chest pain: {Blank single:19197::"yes","no"} Paresthesias: {Blank single:19197::"yes","no"} Glucose Monitoring: {Blank single:19197::"yes","no"}  Accucheck frequency: {Blank single:19197::"Not Checking","Daily","BID","TID"}  Fasting glucose:  Post prandial:  Evening:  Before meals: Taking Insulin?: no Blood Pressure Monitoring: {Blank single:19197::"not checking","rarely","daily","weekly","monthly","a few times a day","a few times a week","a few times a month"} Retinal Examination: {Blank single:19197::"Up to Date","Not up to Date"} Foot Exam: {Blank single:19197::"Up to Date","Not up to Date"} Diabetic Education: {Blank single:19197::"Completed","Not Completed"} Pneumovax: {Blank single:19197::"Up to Date","Not up to Date","unknown"} Influenza: {Blank single:19197::"Up to Date","Not up to Date","unknown"} Aspirin: {Blank single:19197::"yes","no"}   {History (Optional):23778}  ROS    Objective:     There were no vitals taken for this visit. {Vitals History (Optional):23777}  Physical Exam   No results found for any visits on 01/02/22.  {Labs (Optional):23779}  The 10-year ASCVD risk score (Arnett DK, et al., 2019) is: 2.9%    Assessment & Plan:   Problem List Items Addressed This Visit   None   No follow-ups on file.    Charyl Dancer, NP

## 2022-01-02 ENCOUNTER — Encounter: Payer: Self-pay | Admitting: Nurse Practitioner

## 2022-01-02 ENCOUNTER — Ambulatory Visit: Payer: BC Managed Care – PPO | Admitting: Nurse Practitioner

## 2022-01-02 VITALS — BP 128/90 | HR 108 | Temp 97.6°F | Wt 182.8 lb

## 2022-01-02 DIAGNOSIS — E119 Type 2 diabetes mellitus without complications: Secondary | ICD-10-CM

## 2022-01-02 DIAGNOSIS — Z1211 Encounter for screening for malignant neoplasm of colon: Secondary | ICD-10-CM

## 2022-01-02 DIAGNOSIS — Z1212 Encounter for screening for malignant neoplasm of rectum: Secondary | ICD-10-CM

## 2022-01-02 DIAGNOSIS — E1159 Type 2 diabetes mellitus with other circulatory complications: Secondary | ICD-10-CM

## 2022-01-02 DIAGNOSIS — F419 Anxiety disorder, unspecified: Secondary | ICD-10-CM | POA: Diagnosis not present

## 2022-01-02 DIAGNOSIS — I152 Hypertension secondary to endocrine disorders: Secondary | ICD-10-CM | POA: Diagnosis not present

## 2022-01-02 LAB — POCT GLYCOSYLATED HEMOGLOBIN (HGB A1C)
HbA1c POC (<> result, manual entry): 12.2 % (ref 4.0–5.6)
HbA1c, POC (controlled diabetic range): 12.2 % — AB (ref 0.0–7.0)
Hemoglobin A1C: 12.2 % — AB (ref 4.0–5.6)

## 2022-01-02 MED ORDER — EMPAGLIFLOZIN 10 MG PO TABS: 10.0000 mg | ORAL_TABLET | Freq: Every day | ORAL | 0 refills | Status: DC

## 2022-01-02 MED ORDER — LISINOPRIL 10 MG PO TABS: 10.0000 mg | ORAL_TABLET | Freq: Every day | ORAL | 1 refills | Status: DC

## 2022-01-02 MED ORDER — EMPAGLIFLOZIN 25 MG PO TABS: 25.0000 mg | ORAL_TABLET | Freq: Every day | ORAL | 0 refills | Status: DC

## 2022-01-02 MED ORDER — BUPROPION HCL ER (XL) 150 MG PO TB24: 150.0000 mg | ORAL_TABLET | Freq: Every day | ORAL | 0 refills | Status: DC

## 2022-01-02 MED ORDER — METFORMIN HCL 1000 MG PO TABS: 1000.0000 mg | ORAL_TABLET | Freq: Two times a day (BID) | ORAL | 1 refills | Status: DC

## 2022-01-02 MED ORDER — PIOGLITAZONE HCL 15 MG PO TABS: 15.0000 mg | ORAL_TABLET | Freq: Every day | ORAL | 1 refills | Status: DC

## 2022-01-02 NOTE — Assessment & Plan Note (Signed)
Chronic, not controlled. A1c today is 12.2%. Will have her stop the glimepride as she has been on this for several years and it may not be offering good control anymore. Start jardiance 10mg  daily for 30 days then increase to 25mg  daily. Continue checking blood sugars daily. Recommend yearly eye exam. Foot exam up to date. Declines flu and pneumonia. Continue exercising daily. Follow-up in 3 months.

## 2022-01-02 NOTE — Assessment & Plan Note (Signed)
Chronic, worsened in the last few months. She states that she had 2 deaths in her family recently and would like to re-start medication. Will start her on Wellbutrin XL 150mg  daily. Her PHQ-9 today is a 9 and her GAD 7 is an 28. She denies SI/HI. Follow-up in 3 months or sooner with concerns.

## 2022-01-02 NOTE — Patient Instructions (Addendum)
It was great to see you!  Stop the glimepiride and start jardiance 10mg  daily for 30 days. Then increase to jardiance 25mg  daily.   Start Wellbutrin 1 tablet daily.   Let's follow-up in 3 months, sooner if you have concerns.  If a referral was placed today, you will be contacted for an appointment. Please note that routine referrals can sometimes take up to 3-4 weeks to process. Please call our office if you haven't heard anything after this time frame.  Take care,  Vance Peper, NP

## 2022-01-02 NOTE — Assessment & Plan Note (Signed)
Chronic, stable. Continue lisinopril 10mg  daily. Refill sent to the pharmacy. Follow-up in 3 months.

## 2022-01-24 DIAGNOSIS — E876 Hypokalemia: Secondary | ICD-10-CM | POA: Diagnosis not present

## 2022-01-24 DIAGNOSIS — R110 Nausea: Secondary | ICD-10-CM | POA: Diagnosis not present

## 2022-01-24 DIAGNOSIS — D72829 Elevated white blood cell count, unspecified: Secondary | ICD-10-CM | POA: Diagnosis not present

## 2022-01-24 DIAGNOSIS — I10 Essential (primary) hypertension: Secondary | ICD-10-CM | POA: Diagnosis not present

## 2022-01-24 DIAGNOSIS — R079 Chest pain, unspecified: Secondary | ICD-10-CM | POA: Diagnosis not present

## 2022-01-24 DIAGNOSIS — R112 Nausea with vomiting, unspecified: Secondary | ICD-10-CM | POA: Diagnosis not present

## 2022-01-24 DIAGNOSIS — R739 Hyperglycemia, unspecified: Secondary | ICD-10-CM | POA: Diagnosis not present

## 2022-01-24 DIAGNOSIS — K2970 Gastritis, unspecified, without bleeding: Secondary | ICD-10-CM | POA: Diagnosis not present

## 2022-01-24 DIAGNOSIS — Z79899 Other long term (current) drug therapy: Secondary | ICD-10-CM | POA: Diagnosis not present

## 2022-01-24 DIAGNOSIS — N179 Acute kidney failure, unspecified: Secondary | ICD-10-CM | POA: Diagnosis not present

## 2022-01-24 DIAGNOSIS — T50995A Adverse effect of other drugs, medicaments and biological substances, initial encounter: Secondary | ICD-10-CM | POA: Diagnosis not present

## 2022-01-24 DIAGNOSIS — F32A Depression, unspecified: Secondary | ICD-10-CM | POA: Diagnosis not present

## 2022-01-24 DIAGNOSIS — R109 Unspecified abdominal pain: Secondary | ICD-10-CM | POA: Diagnosis not present

## 2022-01-24 DIAGNOSIS — R1110 Vomiting, unspecified: Secondary | ICD-10-CM | POA: Diagnosis not present

## 2022-01-24 DIAGNOSIS — E1110 Type 2 diabetes mellitus with ketoacidosis without coma: Secondary | ICD-10-CM | POA: Diagnosis not present

## 2022-01-24 DIAGNOSIS — E860 Dehydration: Secondary | ICD-10-CM | POA: Diagnosis not present

## 2022-01-24 DIAGNOSIS — R0789 Other chest pain: Secondary | ICD-10-CM | POA: Diagnosis not present

## 2022-01-24 DIAGNOSIS — Z7984 Long term (current) use of oral hypoglycemic drugs: Secondary | ICD-10-CM | POA: Diagnosis not present

## 2022-01-24 DIAGNOSIS — K209 Esophagitis, unspecified without bleeding: Secondary | ICD-10-CM | POA: Diagnosis not present

## 2022-01-26 LAB — BASIC METABOLIC PANEL
CO2: 10 — AB (ref 13–22)
Chloride: 109 — AB (ref 99–108)
Creatinine: 0.4 — AB (ref <=1.1)
Glucose: 198
Potassium: 3.3 mEq/L — AB (ref 3.5–5.1)
Sodium: 133 — AB (ref 137–147)

## 2022-01-26 LAB — COMPREHENSIVE METABOLIC PANEL: Calcium: 7.7 — AB (ref 8.7–10.7)

## 2022-01-26 LAB — CBC AND DIFFERENTIAL
HCT: 34 — AB (ref 36–46)
Hemoglobin: 11.7 — AB (ref 12.0–16.0)
Platelets: 299 10*3/uL (ref 150–400)
WBC: 9.4

## 2022-01-26 LAB — CBC: RBC: 4 (ref 3.87–5.11)

## 2022-01-28 ENCOUNTER — Telehealth: Payer: Self-pay

## 2022-01-28 NOTE — Telephone Encounter (Signed)
Transition Care Management Follow-up Telephone Call Date of discharge and from where: 01/26/22 [facility]. Trying to request records. Patient states she was admitted for DKA. How have you been since you were released from the hospital? I feel like I have been hit be a truck. Any questions or concerns? No  Items Reviewed: Did the pt receive and understand the discharge instructions provided? Yes  Medications obtained and verified? Yes  Other? No  Any new allergies since your discharge? No  Dietary orders reviewed? No Do you have support at home? Yes   Home Care and Equipment/Supplies: Were home health services ordered? not applicable If so, what is the name of the agency? N/a  Has the agency set up a time to come to the patient's home? not applicable Were any new equipment or medical supplies ordered?  No What is the name of the medical supply agency? N/a Were you able to get the supplies/equipment? not applicable Do you have any questions related to the use of the equipment or supplies? No  Functional Questionnaire: (I = Independent and D = Dependent) ADLs: I  Bathing/Dressing- I  Meal Prep- I  Eating- I  Maintaining continence- I  Transferring/Ambulation- I  Managing Meds- I  Follow up appointments reviewed:  PCP Hospital f/u appt confirmed? Yes  Scheduled to see Vance Peper on 01/30/22 @ 11:20am. [facility] f/u appt confirmed? No  Scheduled to see n/a on n/a @ n/a. Pt states she was advised to be seen by Endocrinology Are transportation arrangements needed? No  If their condition worsens, is the pt aware to call PCP or go to the Emergency Dept.? Yes Was the patient provided with contact information for the PCP's office or ED? Yes Was to pt encouraged to call back with questions or concerns? Yes  Angeline Slim, RN, BSN RN Clinical Supervisor [facility] [employer]

## 2022-01-28 NOTE — Telephone Encounter (Signed)
Transition Care Management Unsuccessful Follow-up Telephone Call  Date of discharge and from where:  [place] 01/26/2022  Attempts:  1st Attempt  Reason for unsuccessful TCM follow-up call:  Left voice message Juanda Crumble, [place] Direct Dial [phone]

## 2022-01-28 NOTE — Telephone Encounter (Signed)
Records request faxed 

## 2022-01-28 NOTE — Telephone Encounter (Signed)
Noted  

## 2022-01-29 DIAGNOSIS — I10 Essential (primary) hypertension: Secondary | ICD-10-CM | POA: Diagnosis not present

## 2022-01-29 DIAGNOSIS — Z23 Encounter for immunization: Secondary | ICD-10-CM | POA: Diagnosis not present

## 2022-01-29 DIAGNOSIS — K219 Gastro-esophageal reflux disease without esophagitis: Secondary | ICD-10-CM | POA: Diagnosis not present

## 2022-01-29 DIAGNOSIS — E876 Hypokalemia: Secondary | ICD-10-CM | POA: Diagnosis not present

## 2022-01-29 DIAGNOSIS — B9620 Unspecified Escherichia coli [E. coli] as the cause of diseases classified elsewhere: Secondary | ICD-10-CM | POA: Diagnosis not present

## 2022-01-29 DIAGNOSIS — R112 Nausea with vomiting, unspecified: Secondary | ICD-10-CM | POA: Diagnosis not present

## 2022-01-29 DIAGNOSIS — Z79899 Other long term (current) drug therapy: Secondary | ICD-10-CM | POA: Diagnosis not present

## 2022-01-29 DIAGNOSIS — E860 Dehydration: Secondary | ICD-10-CM | POA: Diagnosis not present

## 2022-01-29 DIAGNOSIS — R52 Pain, unspecified: Secondary | ICD-10-CM | POA: Diagnosis not present

## 2022-01-29 DIAGNOSIS — E1110 Type 2 diabetes mellitus with ketoacidosis without coma: Secondary | ICD-10-CM | POA: Diagnosis not present

## 2022-01-29 DIAGNOSIS — F32A Depression, unspecified: Secondary | ICD-10-CM | POA: Diagnosis not present

## 2022-01-29 DIAGNOSIS — Z880 Allergy status to penicillin: Secondary | ICD-10-CM | POA: Diagnosis not present

## 2022-01-29 DIAGNOSIS — E872 Acidosis, unspecified: Secondary | ICD-10-CM | POA: Diagnosis not present

## 2022-01-29 DIAGNOSIS — Z1152 Encounter for screening for COVID-19: Secondary | ICD-10-CM | POA: Diagnosis not present

## 2022-01-29 DIAGNOSIS — Z794 Long term (current) use of insulin: Secondary | ICD-10-CM | POA: Diagnosis not present

## 2022-01-29 DIAGNOSIS — E1310 Other specified diabetes mellitus with ketoacidosis without coma: Secondary | ICD-10-CM | POA: Diagnosis not present

## 2022-01-29 DIAGNOSIS — N390 Urinary tract infection, site not specified: Secondary | ICD-10-CM | POA: Diagnosis not present

## 2022-01-30 ENCOUNTER — Inpatient Hospital Stay: Payer: BC Managed Care – PPO | Admitting: Nurse Practitioner

## 2022-01-30 ENCOUNTER — Encounter: Payer: Self-pay | Admitting: Nurse Practitioner

## 2022-01-30 DIAGNOSIS — E119 Type 2 diabetes mellitus without complications: Secondary | ICD-10-CM

## 2022-01-31 ENCOUNTER — Telehealth: Payer: Self-pay | Admitting: Nurse Practitioner

## 2022-01-31 NOTE — Telephone Encounter (Signed)
Pt has just been release from [facility] today 01/31/22 for Ketoacidosis from [place]. She is wanting to schedule a hosp f/up with Ander Purpura, please advise pt @ [phone]

## 2022-02-01 NOTE — Telephone Encounter (Signed)
Requested records electronically via Mychart

## 2022-02-01 NOTE — Telephone Encounter (Signed)
Transition Care Management Follow-up Telephone Call Date of discharge and from where: [facility] 01/31/2022 How have you been since you were released from the hospital? better Any questions or concerns? No  Items Reviewed: Did the pt receive and understand the discharge instructions provided? Yes  Medications obtained and verified? Yes  Other? No  Any new allergies since your discharge? No  Dietary orders reviewed? Yes Do you have support at home? Yes   Home Care and Equipment/Supplies: Were home health services ordered? no If so, what is the name of the agency? N/a  Has the agency set up a time to come to the patient's home? not applicable Were any new equipment or medical supplies ordered?  No What is the name of the medical supply agency? N/a Were you able to get the supplies/equipment? not applicable Do you have any questions related to the use of the equipment or supplies? No  Functional Questionnaire: (I = Independent and D = Dependent) ADLs: I  Bathing/Dressing- I  Meal Prep- I  Eating- I  Maintaining continence- I  Transferring/Ambulation- I  Managing Meds- I  Follow up appointments reviewed:  PCP Hospital f/u appt confirmed? Yes  Scheduled to see Vance Peper on 02/07/2022 @ 8:40. [facility] f/u appt confirmed? No  Are transportation arrangements needed? No  If their condition worsens, is the pt aware to call PCP or go to the Emergency Dept.? Yes Was the patient provided with contact information for the PCP's office or ED? Yes Was to pt encouraged to call back with questions or concerns? Yes Juanda Crumble, LPN [place] Direct Dial [phone]

## 2022-02-06 NOTE — Progress Notes (Unsigned)
   Established Patient Office Visit  Subjective   Patient ID: Jessica Li, female    DOB: 05-22-1975  Age: 46 y.o. MRN: [id number]  No chief complaint on file.   HPI  Jessica Li is here to follow-up on DKA hospitalizations from 01/24/22 to 01/26/22 and from 01/29/22 to 01/31/22. She was started on jardiance and started noticing abdominal pain and stopped this medication after about 7 days. The symptoms continued and she went to the ER and was found to be in DKA. She was admitted and given IV fluids and IV insulin. She was discharged on semglee isulin 18 units BID and Humalog.   Transition of Care Hospital Follow up.   Hospital/Facility: [facility]  D/C Physician: Dr. Heron Sabins D/C Date: 01/31/22  Records Requested: 02/01/22 Records Received: 02/06/22 Records Reviewed: 02/07/22  Diagnoses on Discharge: DKA, dehydration, hypokalemia  Date of interactive Contact within 48 hours of discharge: 02/01/22 Contact was through: phone  Date of 7 day or 14 day face-to-face visit:    within 7 days  Outpatient Encounter Medications as of 02/07/2022  Medication Sig   buPROPion (WELLBUTRIN XL) 150 MG 24 hr tablet Take 1 tablet (150 mg total) by mouth daily.   empagliflozin (JARDIANCE) 10 MG TABS tablet Take 1 tablet (10 mg total) by mouth daily before breakfast. Take for 30 days, then increase to 25mg  daily   empagliflozin (JARDIANCE) 25 MG TABS tablet Take 1 tablet (25 mg total) by mouth daily before breakfast.   lisinopril (ZESTRIL) 10 MG tablet Take 1 tablet (10 mg total) by mouth daily.   metFORMIN (GLUCOPHAGE) 1000 MG tablet Take 1 tablet (1,000 mg total) by mouth 2 (two) times daily with a meal.   pioglitazone (ACTOS) 15 MG tablet Take 1 tablet (15 mg total) by mouth daily.   No facility-administered encounter medications on file as of 02/07/2022.    Diagnostic Tests Reviewed/Disposition: Reviewed, sent to be scanned in records  Consults: None in hospital, needs endocrine  consult  Discharge Instructions  Disease/illness Education: Educated today on chronic illness and nutrition  Home Health/Community Services Discussions/Referrals: None  Establishment or re-establishment of referral orders for community resources: None  Discussion with other health care providers: Reviewed notes, will place referral to endocrinology  Assessment and Support of treatment regimen adherence: Reviewed with patient today  Appointments Coordinated with:  Endocrinology  Education for self-management, independent living, and ADLs:  Reviewed with patient  {History (Optional):23778}  ROS    Objective:     There were no vitals taken for this visit. {Vitals History (Optional):23777}  Physical Exam   No results found for any visits on 02/07/22.  {Labs (Optional):23779}  The 10-year ASCVD risk score (Arnett DK, et al., 2019) is: 2.6%    Assessment & Plan:   Problem List Items Addressed This Visit   None   No follow-ups on file.    2020, NP

## 2022-02-07 ENCOUNTER — Encounter: Payer: Self-pay | Admitting: Nurse Practitioner

## 2022-02-07 ENCOUNTER — Ambulatory Visit (INDEPENDENT_AMBULATORY_CARE_PROVIDER_SITE_OTHER): Payer: BC Managed Care – PPO | Admitting: Nurse Practitioner

## 2022-02-07 VITALS — BP 120/80 | HR 87 | Temp 97.3°F | Ht 63.0 in | Wt 174.8 lb

## 2022-02-07 DIAGNOSIS — R600 Localized edema: Secondary | ICD-10-CM

## 2022-02-07 DIAGNOSIS — E1159 Type 2 diabetes mellitus with other circulatory complications: Secondary | ICD-10-CM | POA: Diagnosis not present

## 2022-02-07 DIAGNOSIS — R200 Anesthesia of skin: Secondary | ICD-10-CM

## 2022-02-07 DIAGNOSIS — E1165 Type 2 diabetes mellitus with hyperglycemia: Secondary | ICD-10-CM | POA: Diagnosis not present

## 2022-02-07 DIAGNOSIS — I152 Hypertension secondary to endocrine disorders: Secondary | ICD-10-CM

## 2022-02-07 DIAGNOSIS — R202 Paresthesia of skin: Secondary | ICD-10-CM

## 2022-02-07 LAB — COMPREHENSIVE METABOLIC PANEL WITH GFR
ALT: 11 U/L (ref 0–35)
AST: 12 U/L (ref 0–37)
Albumin: 3.6 g/dL (ref 3.5–5.2)
Alkaline Phosphatase: 56 U/L (ref 39–117)
BUN: 11 mg/dL (ref 6–23)
CO2: 32 meq/L (ref 19–32)
Calcium: 8.7 mg/dL (ref 8.4–10.5)
Chloride: 101 meq/L (ref 96–112)
Creatinine, Ser: 0.51 mg/dL (ref 0.40–1.20)
GFR: 111.61 mL/min
Glucose, Bld: 260 mg/dL — ABNORMAL HIGH (ref 70–99)
Potassium: 4.3 meq/L (ref 3.5–5.1)
Sodium: 140 meq/L (ref 135–145)
Total Bilirubin: 0.3 mg/dL (ref 0.2–1.2)
Total Protein: 6.3 g/dL (ref 6.0–8.3)

## 2022-02-07 LAB — LIPID PANEL
Cholesterol: 189 mg/dL (ref 0–200)
HDL: 60.2 mg/dL (ref >39.00)
LDL Cholesterol: 100 mg/dL — ABNORMAL HIGH (ref 0–99)
NonHDL: 129.11
Total CHOL/HDL Ratio: 3
Triglycerides: 147 mg/dL (ref 0.0–149.0)
VLDL: 29.4 mg/dL (ref 0.0–40.0)

## 2022-02-07 LAB — VITAMIN B12: Vitamin B-12: 512 pg/mL (ref 211–911)

## 2022-02-07 MED ORDER — INSULIN GLARGINE-YFGN 100 UNIT/ML ~~LOC~~ SOPN: 20.0000 [IU] | PEN_INJECTOR | Freq: Two times a day (BID) | SUBCUTANEOUS | 1 refills | Status: DC

## 2022-02-07 MED ORDER — METFORMIN HCL 500 MG PO TABS: 500.0000 mg | ORAL_TABLET | Freq: Two times a day (BID) | ORAL | 1 refills | Status: DC

## 2022-02-07 NOTE — Assessment & Plan Note (Signed)
Chronic, stable.  BP today 120/80.  Continue lisinopril 10 mg daily.  Recent labs reviewed.  Her potassium was low when she was taking supplements for this which she finished a few days ago.  We will check BMP today.

## 2022-02-07 NOTE — Assessment & Plan Note (Addendum)
She was recently in the hospital for DKA after starting Jardiance 10 mg daily.  She is now currently taking metformin 500 mg twice a day and Semglee insulin along with NovoLog insulin for meals.  Her blood sugars are still not fully controlled, will increase her Semglee insulin to 20 units twice a day and keep the same sliding scale for her NovoLog insulin.  Offered her the continuous glucose monitor, however she declined.  Recent labs reviewed from hospitalization, including CMP, CBC.  She has a referral that was placed to endocrinology, however she has not heard yet to schedule an appointment.  We will reach out to our referral coordinator to follow-up on this.  Her cholesterol has been elevated in the past, she was wanting to see if she could change things on her own before starting medication.  We will check fasting lipid panel today. Records reviewed from hospitalization. Medication reconciliation completed.

## 2022-02-07 NOTE — Patient Instructions (Addendum)
It was great to see you!  Increase your semglee insulin to 20 units twice a day.   Keep taking the humalog as prescribed.   We are reaching out to our referral coordinator about your referral to endocrine   Let's follow-up in 6-8 weeks, sooner if you have concerns.  If a referral was placed today, you will be contacted for an appointment. Please note that routine referrals can sometimes take up to 3-4 weeks to process. Please call our office if you haven't heard anything after this time frame.  Take care,  Rodman Pickle, NP

## 2022-03-06 ENCOUNTER — Encounter: Payer: Self-pay | Admitting: Nurse Practitioner

## 2022-03-06 ENCOUNTER — Telehealth: Payer: Self-pay | Admitting: Nurse Practitioner

## 2022-03-06 MED ORDER — INSULIN LISPRO (1 UNIT DIAL) 100 UNIT/ML (KWIKPEN): PEN_INJECTOR | SUBCUTANEOUS | 0 refills | Status: DC

## 2022-03-06 MED ORDER — INSULIN GLARGINE-YFGN 100 UNIT/ML ~~LOC~~ SOPN: 20.0000 [IU] | PEN_INJECTOR | Freq: Two times a day (BID) | SUBCUTANEOUS | 1 refills | Status: DC

## 2022-03-06 NOTE — Telephone Encounter (Signed)
Caller Name: [facility] pharmacy Call back phone #: [phone]  Reason for Call: Please call pharmacy to verify prescription sent over. There is some confusion regarding a quantity

## 2022-03-07 NOTE — Telephone Encounter (Signed)
Spoke w/ [facility]- needing verbal to change dispense quantity to 57mL (1 box), they are unable to break the box. Spoke w/ pharmacist- verbal given to 12mL and 1 refill.

## 2022-03-12 NOTE — Patient Instructions (Incomplete)

## 2022-03-14 ENCOUNTER — Ambulatory Visit: Payer: Self-pay | Admitting: Nurse Practitioner

## 2022-03-14 DIAGNOSIS — E1165 Type 2 diabetes mellitus with hyperglycemia: Secondary | ICD-10-CM

## 2022-03-21 ENCOUNTER — Ambulatory Visit: Payer: BC Managed Care – PPO | Admitting: Nurse Practitioner

## 2022-04-04 ENCOUNTER — Encounter: Payer: BC Managed Care – PPO | Admitting: Nurse Practitioner

## 2022-04-24 NOTE — Patient Instructions (Signed)

## 2022-04-25 ENCOUNTER — Ambulatory Visit (INDEPENDENT_AMBULATORY_CARE_PROVIDER_SITE_OTHER): Payer: BC Managed Care – PPO | Admitting: Nurse Practitioner

## 2022-04-25 ENCOUNTER — Encounter: Payer: Self-pay | Admitting: Nurse Practitioner

## 2022-04-25 ENCOUNTER — Telehealth: Payer: Self-pay | Admitting: Nurse Practitioner

## 2022-04-25 VITALS — BP 131/78 | HR 94 | Ht 63.0 in | Wt 182.4 lb

## 2022-04-25 DIAGNOSIS — E1165 Type 2 diabetes mellitus with hyperglycemia: Secondary | ICD-10-CM

## 2022-04-25 LAB — POCT GLYCOSYLATED HEMOGLOBIN (HGB A1C): Hemoglobin A1C: 8.1 % — AB (ref 4.0–5.6)

## 2022-04-25 MED ORDER — INSULIN GLARGINE-YFGN 100 UNIT/ML ~~LOC~~ SOPN: 30.0000 [IU] | PEN_INJECTOR | Freq: Every day | SUBCUTANEOUS | 1 refills | Status: DC

## 2022-04-25 MED ORDER — INSULIN LISPRO (1 UNIT DIAL) 100 UNIT/ML (KWIKPEN): 5.0000 [IU] | PEN_INJECTOR | Freq: Three times a day (TID) | SUBCUTANEOUS | 3 refills | Status: DC

## 2022-04-25 MED ORDER — LANTUS SOLOSTAR 100 UNIT/ML ~~LOC~~ SOPN: 30.0000 [IU] | PEN_INJECTOR | Freq: Every day | SUBCUTANEOUS | 3 refills | Status: DC

## 2022-04-25 NOTE — Telephone Encounter (Signed)
Do you want to cancel this prescription, the pharmacy would like to know what they need to do

## 2022-04-25 NOTE — Telephone Encounter (Signed)
You prescribed Semglee 3 ML- is only 1 pen  Pharmacy wants to know if they can  do 15 ML which is 5 pens since they can't break up a box   Also, if Ins prefers can they do Lantus instead of Semglee?

## 2022-04-25 NOTE — Telephone Encounter (Signed)
I fixed it.  I sent new script for Lantus to the pharmacy.  The patient does not have to pick up the insulin until she needs it.

## 2022-04-25 NOTE — Telephone Encounter (Signed)
Well, I didn't mean to send in a prescription just yet.  She has plenty at home. OOPS

## 2022-04-25 NOTE — Progress Notes (Signed)
Endocrinology Consult Note       04/25/2022, 4:35 PM   Subjective:    Patient ID: Jessica Li, female    DOB: May 02, 1975.  Jessica Li is being seen in consultation for management of currently uncontrolled symptomatic diabetes requested by  Gerre Scull, NP.   Past Medical History:  Diagnosis Date   Diabetes mellitus without complication (HCC)    Hypertension     Past Surgical History:  Procedure Laterality Date   CESAREAN SECTION      Social History   Socioeconomic History   Marital status: Married    Spouse name: Not on file   Number of children: Not on file   Years of education: Not on file   Highest education level: Not on file  Occupational History   Not on file  Tobacco Use   Smoking status: Never   Smokeless tobacco: Never  Vaping Use   Vaping Use: Never used  Substance and Sexual Activity   Alcohol use: Never   Drug use: Never   Sexual activity: Yes    Birth control/protection: None  Other Topics Concern   Not on file  Social History Narrative   Not on file   Social Determinants of Health   Financial Resource Strain: Not on file  Food Insecurity: Not on file  Transportation Needs: Not on file  Physical Activity: Not on file  Stress: Not on file  Social Connections: Not on file    Family History  Problem Relation Age of Onset   Heart disease Mother    Heart disease Father    Cancer Sister        melanoma    Outpatient Encounter Medications as of 04/25/2022  Medication Sig   buPROPion (WELLBUTRIN XL) 150 MG 24 hr tablet Take 1 tablet (150 mg total) by mouth daily.   lisinopril (ZESTRIL) 10 MG tablet Take 1 tablet (10 mg total) by mouth daily.   metFORMIN (GLUCOPHAGE) 500 MG tablet Take 1 tablet (500 mg total) by mouth 2 (two) times daily with a meal.   [DISCONTINUED] insulin glargine-yfgn (SEMGLEE, YFGN,) 100 UNIT/ML Pen Inject 20 Units into the skin 2 (two)  times daily.   [DISCONTINUED] insulin lispro (HUMALOG) 100 UNIT/ML KwikPen Per previous sliding scale   insulin lispro (HUMALOG) 100 UNIT/ML KwikPen Inject 5-8 Units into the skin 3 (three) times daily. Per previous sliding scale   [DISCONTINUED] insulin glargine-yfgn (SEMGLEE, YFGN,) 100 UNIT/ML Pen Inject 30 Units into the skin at bedtime.   No facility-administered encounter medications on file as of 04/25/2022.    ALLERGIES: Allergies  Allergen Reactions   Co-Trimoxazole [Sulfamethoxazole-Trimethoprim] Hives   Empagliflozin Nausea And Vomiting    Caused DKA    VACCINATION STATUS: Immunization History  Administered Date(s) Administered   Moderna Covid-19 Vaccine Bivalent Booster 17yrs & up 03/29/2020   Moderna Sars-Covid-2 Vaccination 05/27/2019, 06/29/2019   Td 07/19/2021    Diabetes She presents for her initial diabetic visit. She has type 2 diabetes mellitus. Onset time: Diagnosed at approx age of 55. Her disease course has been improving. There are no hypoglycemic associated symptoms. There are no hypoglycemic complications. (DKA) Risk factors for coronary artery disease include  diabetes mellitus and hypertension. Current diabetic treatment includes intensive insulin program and oral agent (monotherapy) (Semglee 20 units BID; Humalog 2-4 units TID; Metformin 1000 mg BID). She is compliant with treatment most of the time. Her weight is fluctuating minimally. She is following a generally healthy diet. (She presents today for her consultation with no meter or logs to review.  Her POCT A1c today is 8.1%, improving from last A1c of 12.2%.  She monitors glucose 4-5 times per day, not interested in CGM.  She drinks coffee with splenda and SF creamer and water throughout the day.  She mostly skips breakfast, eats lunch and supper and snacks.  She engages in routine physical activity but admits she could do better.  She is due for eye exam, has never been seen by podiatry in the past.  She  denies any hypoglycemia but does report she typically wakes in the morning with higher readings than when she went to bed.)     Review of systems  Constitutional: + Minimally fluctuating body weight, current Body mass index is 32.31 kg/m., no fatigue, no subjective hyperthermia, no subjective hypothermia Eyes: no blurry vision, no xerophthalmia ENT: no sore throat, no nodules palpated in throat, no dysphagia/odynophagia, no hoarseness Cardiovascular: no chest pain, no shortness of breath, no palpitations, no leg swelling Respiratory: no cough, no shortness of breath Gastrointestinal: no nausea/vomiting/diarrhea Musculoskeletal: no muscle/joint aches Skin: no rashes, no hyperemia Neurological: no tremors, no numbness, no tingling, no dizziness Psychiatric: no depression, no anxiety  Objective:     BP 131/78 (BP Location: Left Arm, Patient Position: Sitting, Cuff Size: Normal)   Pulse 94   Ht 5\' 3"  (1.6 m)   Wt 182 lb 6.4 oz (82.7 kg)   BMI 32.31 kg/m   Wt Readings from Last 3 Encounters:  04/25/22 182 lb 6.4 oz (82.7 kg)  02/07/22 174 lb 12.8 oz (79.3 kg)  01/02/22 182 lb 12.8 oz (82.9 kg)     BP Readings from Last 3 Encounters:  04/25/22 131/78  02/07/22 120/80  01/02/22 (!) 128/90     Physical Exam- Limited  Constitutional:  Body mass index is 32.31 kg/m. , not in acute distress, normal state of mind Eyes:  EOMI, no exophthalmos Neck: Supple Cardiovascular: RRR, no murmurs, rubs, or gallops, no edema Respiratory: Adequate breathing efforts, no crackles, rales, rhonchi, or wheezing Musculoskeletal: no gross deformities, strength intact in all four extremities, no gross restriction of joint movements Skin:  no rashes, no hyperemia Neurological: no tremor with outstretched hands    CMP ( most recent) CMP     Component Value Date/Time   NA 140 02/07/2022 0916   NA 133 (A) 01/26/2022 0000   K 4.3 02/07/2022 0916   CL 101 02/07/2022 0916   CO2 32 02/07/2022  0916   GLUCOSE 260 (H) 02/07/2022 0916   BUN 11 02/07/2022 0916   CREATININE 0.51 02/07/2022 0916   CALCIUM 8.7 02/07/2022 0916   PROT 6.3 02/07/2022 0916   ALBUMIN 3.6 02/07/2022 0916   AST 12 02/07/2022 0916   ALT 11 02/07/2022 0916   ALKPHOS 56 02/07/2022 0916   BILITOT 0.3 02/07/2022 0916   GFRNONAA >60 12/26/2009 1416   GFRAA  12/26/2009 1416    >60        The eGFR has been calculated using the MDRD equation. This calculation has not been validated in all clinical situations. eGFR's persistently <60 mL/min signify possible Chronic Kidney Disease.     Diabetic Labs (most recent): Lab  Results  Component Value Date   HGBA1C 8.1 (A) 04/25/2022   HGBA1C 12.2 (A) 01/02/2022   HGBA1C 12.2 01/02/2022   HGBA1C 12.2 (A) 01/02/2022   MICROALBUR 1.0 07/19/2021     Lipid Panel ( most recent) Lipid Panel     Component Value Date/Time   CHOL 189 02/07/2022 0916   TRIG 147.0 02/07/2022 0916   HDL 60.20 02/07/2022 0916   CHOLHDL 3 02/07/2022 0916   VLDL 29.4 02/07/2022 0916   LDLCALC 100 (H) 02/07/2022 0916   LDLDIRECT 188.0 07/19/2021 0858      Lab Results  Component Value Date   TSH 2.15 07/19/2021           Assessment & Plan:   1) Type 2 diabetes mellitus with hyperglycemia, without long-term current use of insulin ([place])  She presents today for her consultation with no meter or logs to review.  Her POCT A1c today is 8.1%, improving from last A1c of 12.2%.  She monitors glucose 4-5 times per day, not interested in CGM.  She drinks coffee with splenda and SF creamer and water throughout the day.  She mostly skips breakfast, eats lunch and supper and snacks.  She engages in routine physical activity but admits she could do better.  She is due for eye exam, has never been seen by podiatry in the past.  She denies any hypoglycemia but does report she typically wakes in the morning with higher readings than when she went to bed.  - Jessica Li has currently  uncontrolled symptomatic type 2 DM since 47 years of age, with most recent A1c of 8.1 %.   -Recent labs reviewed.  - I had a long discussion with her about the progressive nature of diabetes and the pathology behind its complications. -her diabetes is not currently complicated but she remains at a high risk for more acute and chronic complications which include CAD, CVA, CKD, retinopathy, and neuropathy. These are all discussed in detail with her.  The following Lifestyle Medicine recommendations according to [facility]) were discussed and offered to patient and she agrees to start the journey:  A. Whole Foods, Plant-based plate comprising of fruits and vegetables, plant-based proteins, whole-grain carbohydrates was discussed in detail with the patient.   A list for source of those nutrients were also provided to the patient.  Patient will use only water or unsweetened tea for hydration. B.  The need to stay away from risky substances including alcohol, smoking; obtaining 7 to 9 hours of restorative sleep, at least 150 minutes of moderate intensity exercise weekly, the importance of healthy social connections,  and stress reduction techniques were discussed. C.  A full color page of  Calorie density of various food groups per pound showing examples of each food groups was provided to the patient.  - I have counseled her on diet and weight management by adopting a carbohydrate restricted/protein rich diet. Patient is encouraged to switch to unprocessed or minimally processed complex starch and increased protein intake (animal or plant source), fruits, and vegetables. -  she is advised to stick to a routine mealtimes to eat 3 meals a day and avoid unnecessary snacks (to snack only to correct hypoglycemia).   - she acknowledges that there is a room for improvement in her food and drink choices. - Suggestion is made for her to avoid simple carbohydrates from her diet  including Cakes, Sweet Desserts, Ice Cream, Soda (diet and regular), Sweet Tea, Candies, Chips,  Cookies, Store Bought Juices, Alcohol in Excess of 1-2 drinks a day, Artificial Sweeteners, Coffee Creamer, and "Sugar-free" Products. This will help patient to have more stable blood glucose profile and potentially avoid unintended weight gain.  - I have approached her with the following individualized plan to manage her diabetes and patient agrees:   -She is advised to change her Semglee to 30 units SQ nightly (will change to Lantus due to insurance preference).  Will also adjust her Humalog to 5-8 units TID with meals if glucose is above 90 and she is eating (Specific instructions on how to titrate insulin dosage based on glucose readings given to patient in writing).  She demonstrated her ability to properly use the SSI to dose mealtime insulin with me today.  -she is encouraged to start monitoring glucose 4 times daily, before meals and before bed, to log their readings on the clinic sheets provided, and bring them to review at follow up appointment in 4 weeks.  - she is warned not to take insulin without proper monitoring per orders. - Adjustment parameters are given to her for hypo and hyperglycemia in writing. - she is encouraged to call clinic for blood glucose levels less than 70 or above 300 mg /dl. - she is advised to continue Metformin 1000 mg po twice daily with meals, therapeutically suitable for patient .  -She is not a candidate for Jardiance, experienced DKA with it in the past.  - Specific targets for  A1c; LDL, HDL, and Triglycerides were discussed with the patient.  2) Blood Pressure /Hypertension:  her blood pressure is controlled to target.   she is advised to continue her current medications including Lisinopril 10 mg p.o. daily with breakfast.  3) Lipids/Hyperlipidemia:    Review of her recent lipid panel from 02/07/22 showed controlled LDL at 100.  she is not currently on any  lipid lowering medications, wants to try conservative measures first with diet and exercise.   4)  Weight/Diet:  her Body mass index is 32.31 kg/m.  -  clearly complicating her diabetes care.   she is a candidate for weight loss. I discussed with her the fact that loss of 5 - 10% of her  current body weight will have the most impact on her diabetes management.  Exercise, and detailed carbohydrates information provided  -  detailed on discharge instructions.  5) Chronic Care/Health Maintenance: -she is on ACEI/ARB and Statin medications and is encouraged to initiate and continue to follow up with Ophthalmology, Dentist, Podiatrist at least yearly or according to recommendations, and advised to stay away from smoking. I have recommended yearly flu vaccine and pneumonia vaccine at least every 5 years; moderate intensity exercise for up to 150 minutes weekly; and sleep for at least 7 hours a day.  - she is advised to maintain close follow up with Charyl Dancer, NP for primary care needs, as well as her other providers for optimal and coordinated care.   - Time spent in this patient care: 60 min, of which > 50% was spent in counseling her about her diabetes and the rest reviewing her blood glucose logs, discussing her hypoglycemia and hyperglycemia episodes, reviewing her current and previous labs/studies (including abstraction from other facilities) and medications doses and developing a long term treatment plan based on the latest standards of care/guidelines; and documenting her care.    Please refer to Patient Instructions for Blood Glucose Monitoring and Insulin/Medications Dosing Guide" in media tab for additional information. Please  also refer to "Patient Self Inventory" in the Media tab for reviewed elements of pertinent patient history.  Jessica Li participated in the discussions, expressed understanding, and voiced agreement with the above plans.  All questions were answered to her  satisfaction. she is encouraged to contact clinic should she have any questions or concerns prior to her return visit.     Follow up plan: - Return in about 4 weeks (around 05/23/2022) for Diabetes F/U, Bring meter and logs.    Ronny Bacon, [facility] [address] [place], [place] [address] Phone: [phone] Fax: [phone]  04/25/2022, 4:36 PM

## 2022-05-03 ENCOUNTER — Other Ambulatory Visit: Payer: Self-pay | Admitting: Nurse Practitioner

## 2022-05-03 MED ORDER — BUPROPION HCL ER (XL) 150 MG PO TB24: 150.0000 mg | ORAL_TABLET | Freq: Every day | ORAL | 0 refills | Status: DC

## 2022-05-03 NOTE — Telephone Encounter (Signed)
Chart supports Rx Last OV: 01/2022 Next OV: not scheduled   

## 2022-05-06 ENCOUNTER — Telehealth: Payer: Self-pay | Admitting: Nurse Practitioner

## 2022-05-06 ENCOUNTER — Encounter: Payer: Self-pay | Admitting: Nurse Practitioner

## 2022-05-06 MED ORDER — LANTUS SOLOSTAR 100 UNIT/ML ~~LOC~~ SOPN: 30.0000 [IU] | PEN_INJECTOR | Freq: Every day | SUBCUTANEOUS | 3 refills | Status: DC

## 2022-05-06 NOTE — Telephone Encounter (Signed)
I had Lantus listed in my notes as what she was taking so I sent in for Lantus 30 ml with 3 refills.  Hopefully that will correct the issue.

## 2022-05-06 NOTE — Telephone Encounter (Signed)
Pharmacy called stating they need a call back for a verbal to change her Semglee prescription from 3 ML with 1 refill to 15 ML because a box comes with 5 pens and they can't break up the box.  [facility] [place] [phone]

## 2022-05-17 ENCOUNTER — Other Ambulatory Visit: Payer: Self-pay | Admitting: Nurse Practitioner

## 2022-05-23 ENCOUNTER — Ambulatory Visit: Payer: BC Managed Care – PPO | Admitting: Nurse Practitioner

## 2022-06-04 DIAGNOSIS — H6692 Otitis media, unspecified, left ear: Secondary | ICD-10-CM | POA: Diagnosis not present

## 2022-06-04 DIAGNOSIS — J029 Acute pharyngitis, unspecified: Secondary | ICD-10-CM | POA: Diagnosis not present

## 2022-06-04 DIAGNOSIS — H6122 Impacted cerumen, left ear: Secondary | ICD-10-CM | POA: Diagnosis not present

## 2022-06-08 ENCOUNTER — Other Ambulatory Visit: Payer: Self-pay | Admitting: Nurse Practitioner

## 2022-06-24 ENCOUNTER — Ambulatory Visit: Payer: BC Managed Care – PPO | Admitting: Nurse Practitioner

## 2022-07-09 ENCOUNTER — Other Ambulatory Visit: Payer: Self-pay | Admitting: Nurse Practitioner

## 2022-07-09 MED ORDER — METFORMIN HCL 1000 MG PO TABS: 1000.0000 mg | ORAL_TABLET | Freq: Two times a day (BID) | ORAL | 1 refills | Status: DC

## 2022-07-09 NOTE — Progress Notes (Signed)
Metformin refill sent to the pharmacy. 

## 2022-07-23 ENCOUNTER — Telehealth: Payer: Self-pay | Admitting: Nurse Practitioner

## 2022-07-23 ENCOUNTER — Ambulatory Visit: Payer: BC Managed Care – PPO | Admitting: Nurse Practitioner

## 2022-07-23 DIAGNOSIS — E1165 Type 2 diabetes mellitus with hyperglycemia: Secondary | ICD-10-CM

## 2022-07-23 MED ORDER — INSULIN GLARGINE-YFGN 100 UNIT/ML ~~LOC~~ SOPN: 30.0000 [IU] | PEN_INJECTOR | Freq: Every day | SUBCUTANEOUS | 0 refills | Status: DC

## 2022-07-23 NOTE — Telephone Encounter (Signed)
Pt came to appt, no meter or logs, she said she needs a refill on her Semglee sent to [facility]. I moved her appt tp 5/8

## 2022-07-23 NOTE — Telephone Encounter (Signed)
Ok, I sent in limited refill for her

## 2022-08-07 ENCOUNTER — Ambulatory Visit: Payer: BC Managed Care – PPO | Admitting: Nurse Practitioner

## 2022-08-07 ENCOUNTER — Other Ambulatory Visit: Payer: Self-pay | Admitting: Nurse Practitioner

## 2022-08-08 MED ORDER — BUPROPION HCL ER (XL) 150 MG PO TB24: 150.0000 mg | ORAL_TABLET | Freq: Every day | ORAL | 0 refills | Status: DC

## 2022-08-08 NOTE — Telephone Encounter (Signed)
Refill request for  Bupropion 150 mg LR  05/03/22, #90, 0 rf LOV  02/07/22 FOV   08/13/22  Please review and advise. Thanks. Dm/cma

## 2022-08-13 ENCOUNTER — Encounter: Payer: Self-pay | Admitting: Nurse Practitioner

## 2022-08-13 ENCOUNTER — Ambulatory Visit (INDEPENDENT_AMBULATORY_CARE_PROVIDER_SITE_OTHER): Payer: BC Managed Care – PPO | Admitting: Nurse Practitioner

## 2022-08-13 VITALS — BP 116/78 | HR 104 | Temp 97.1°F | Ht 63.0 in | Wt 183.4 lb

## 2022-08-13 DIAGNOSIS — E1165 Type 2 diabetes mellitus with hyperglycemia: Secondary | ICD-10-CM | POA: Diagnosis not present

## 2022-08-13 DIAGNOSIS — I152 Hypertension secondary to endocrine disorders: Secondary | ICD-10-CM

## 2022-08-13 DIAGNOSIS — F419 Anxiety disorder, unspecified: Secondary | ICD-10-CM

## 2022-08-13 DIAGNOSIS — Z794 Long term (current) use of insulin: Secondary | ICD-10-CM | POA: Diagnosis not present

## 2022-08-13 DIAGNOSIS — E1159 Type 2 diabetes mellitus with other circulatory complications: Secondary | ICD-10-CM

## 2022-08-13 LAB — LIPID PANEL
Cholesterol: 204 mg/dL — ABNORMAL HIGH (ref 0–200)
HDL: 64 mg/dL (ref >39.00)
LDL Cholesterol: 119 mg/dL — ABNORMAL HIGH (ref 0–99)
NonHDL: 139.9
Total CHOL/HDL Ratio: 3
Triglycerides: 105 mg/dL (ref 0.0–149.0)
VLDL: 21 mg/dL (ref 0.0–40.0)

## 2022-08-13 LAB — COMPREHENSIVE METABOLIC PANEL
ALT: 11 U/L (ref 0–35)
AST: 10 U/L (ref 0–37)
Albumin: 4 g/dL (ref 3.5–5.2)
Alkaline Phosphatase: 69 U/L (ref 39–117)
BUN: 12 mg/dL (ref 6–23)
CO2: 25 mEq/L (ref 19–32)
Calcium: 9.4 mg/dL (ref 8.4–10.5)
Chloride: 100 mEq/L (ref 96–112)
Creatinine, Ser: 0.66 mg/dL (ref 0.40–1.20)
GFR: 104.51 mL/min (ref >60.00)
Glucose, Bld: 246 mg/dL — ABNORMAL HIGH (ref 70–99)
Potassium: 4.3 mEq/L (ref 3.5–5.1)
Sodium: 135 mEq/L (ref 135–145)
Total Bilirubin: 0.4 mg/dL (ref 0.2–1.2)
Total Protein: 7.3 g/dL (ref 6.0–8.3)

## 2022-08-13 LAB — MICROALBUMIN / CREATININE URINE RATIO
Creatinine,U: 101.3 mg/dL
Microalb Creat Ratio: 0.9 mg/g (ref 0.0–30.0)
Microalb, Ur: 0.9 mg/dL (ref 0.0–1.9)

## 2022-08-13 LAB — CBC
HCT: 37.3 % (ref 36.0–46.0)
Hemoglobin: 12.1 g/dL (ref 12.0–15.0)
MCHC: 32.4 g/dL (ref 30.0–36.0)
MCV: 78 fl (ref 78.0–100.0)
Platelets: 524 10*3/uL — ABNORMAL HIGH (ref 150.0–400.0)
RBC: 4.78 Mil/uL (ref 3.87–5.11)
RDW: 15.1 % (ref 11.5–15.5)
WBC: 7 10*3/uL (ref 4.0–10.5)

## 2022-08-13 MED ORDER — BUPROPION HCL ER (XL) 300 MG PO TB24: 300.0000 mg | ORAL_TABLET | Freq: Every day | ORAL | 0 refills | Status: DC

## 2022-08-13 MED ORDER — LISINOPRIL 10 MG PO TABS: 10.0000 mg | ORAL_TABLET | Freq: Every day | ORAL | 1 refills | Status: DC

## 2022-08-13 NOTE — Assessment & Plan Note (Signed)
Chronic, not controlled.  She is currently taking Wellbutrin 150 mg daily, however she feels like her anxiety has slightly worsened recently.  She is also been slightly more tearful, however does deny depression.  Her PHQ-9 is a 4 and her GAD-7 is a 5.  She denies SI/HI.  Will increase her Wellbutrin to 300 mg daily.  She has taken Lexapro and Prozac in the past, did discuss possibly starting Prozac again in addition to the Wellbutrin.  Will see how she does first on just increasing the Wellbutrin dose.  Follow-up in 6 months or sooner with concerns.

## 2022-08-13 NOTE — Assessment & Plan Note (Signed)
Chronic, not controlled.  Most recent A1c in January 2024 was 8.1% she is currently following with endocrinology and is currently taking Semglee 30 units daily and Humalog 5 to 8 units 3 times a day with meals.  She states that her sugars were doing really well until she was on prednisone for recent sinus and ear infection.  Since then her blood sugars have been 200s fasting and then 170s to 190s in the afternoons.  They are starting to come back down, however are still more elevated than they were before.  She is also taking metformin 1000 mg twice a day.  Foot exam normal today.  Discussed getting a yearly eye exam.  Also recommended pneumonia vaccine.  Will check CMP, CBC, lipid panel today.  She has a follow-up appointment with endocrinology next month and will have her A1c checked then.  Follow-up in 6 months.

## 2022-08-13 NOTE — Patient Instructions (Signed)
It was great to see you!  Increase your wellbutrin to 300mg  (you can take 2 of your current tablets).   Let me know if this is not helping.   I have refilled your lisinopril.   Keep following with endocrinology.   I recommend that you schedule an eye exam yearly.   Let's follow-up in 6 months, sooner if you have concerns.  If a referral was placed today, you will be contacted for an appointment. Please note that routine referrals can sometimes take up to 3-4 weeks to process. Please call our office if you haven't heard anything after this time frame.  Take care,  Rodman Pickle, NP

## 2022-08-13 NOTE — Progress Notes (Signed)
Established Patient Office Visit  Subjective   Patient ID: Jessica Li, female    DOB: Aug 01, 1975  Age: 47 y.o. MRN: [id number]  Chief Complaint  Patient presents with   Medication Management    Rx refills, concerns that she may need something in addition to the Wellbutrin    HPI  Jessica Li is here to follow up on diabetes, hypertension, and anxiety.   Her anxiety is not too well controlled.  She is having more stress at work, some trouble concentrating.  She also notes that she is having some emotional spells and states that she will cry at times.  She denies depression, SI/HI.  She will sometimes have trouble falling asleep but will sleep through the night when she does go to sleep.    DIABETES/HYPERTENSION  Hypoglycemic episodes: one - woke up in a sweat - was 63 Polydipsia/polyuria: no Visual disturbance: no Chest pain: no Paresthesias: no Glucose Monitoring: yes  Accucheck frequency: TID  Fasting glucose: 200s - since taking prednisone  Post prandial: 170-190 Taking Insulin?: yes  Long acting insulin: 30  Short acting insulin: sliding scale 5-8 units Blood Pressure Monitoring: not checking Retinal Examination: Not up to Date Foot Exam: Up to Date Diabetic Education: Completed Pneumovax: Not up to Date Influenza:  postponed to flu season      08/13/2022    8:06 AM 02/07/2022    8:42 AM 01/02/2022    9:04 AM 07/19/2021    9:00 AM  Depression screen PHQ 2/9  Decreased Interest 0 1 1 1   Down, Depressed, Hopeless 1 1 1 1   PHQ - 2 Score 1 2 2 2   Altered sleeping 1  1 1   Tired, decreased energy 1  1 2   Change in appetite 0  2 2  Feeling bad or failure about yourself  0  1 0  Trouble concentrating 1  2 2   Moving slowly or fidgety/restless 0  0 0  Suicidal thoughts 0  0 0  PHQ-9 Score 4  9 9   Difficult doing work/chores Not difficult at all  Somewhat difficult Not difficult at all      08/13/2022    8:07 AM 01/02/2022    9:04 AM 07/19/2021    9:00 AM   GAD 7 : Generalized Anxiety Score  Nervous, Anxious, on Edge 1 2 3   Control/stop worrying 2 2 3   Worry too much - different things 1 2 3   Trouble relaxing 0 2 1  Restless 0 1 0  Easily annoyed or irritable 1 2 3   Afraid - awful might happen 0 0 0  Total GAD 7 Score 5 11 13   Anxiety Difficulty  Somewhat difficult Not difficult at all      ROS See pertinent positives and negatives per HPI.    Objective:     BP 116/78 (BP Location: Left Arm)   Pulse (!) 104   Temp (!) 97.1 F (36.2 C)   Ht 5\' 3"  (1.6 m)   Wt 183 lb 6.4 oz (83.2 kg)   LMP 08/04/2022 (Approximate)   SpO2 98%   BMI 32.49 kg/m    Physical Exam Vitals and nursing note reviewed.  Constitutional:      General: She is not in acute distress.    Appearance: Normal appearance.  HENT:     Head: Normocephalic.  Eyes:     Conjunctiva/sclera: Conjunctivae normal.  Cardiovascular:     Rate and Rhythm: Normal rate and regular rhythm.  Pulses: Normal pulses.     Heart sounds: Normal heart sounds.  Pulmonary:     Effort: Pulmonary effort is normal.     Breath sounds: Normal breath sounds.  Musculoskeletal:     Cervical back: Normal range of motion.  Skin:    General: Skin is warm.  Neurological:     General: No focal deficit present.     Mental Status: She is alert and oriented to person, place, and time.  Psychiatric:        Mood and Affect: Mood normal.        Behavior: Behavior normal.        Thought Content: Thought content normal.        Judgment: Judgment normal.    Diabetic Foot Exam - Simple   Simple Foot Form Visual Inspection No deformities, no ulcerations, no other skin breakdown bilaterally: Yes Sensation Testing Intact to touch and monofilament testing bilaterally: Yes Pulse Check Posterior Tibialis and Dorsalis pulse intact bilaterally: Yes Comments     No results found for any visits on 08/13/22.    The 10-year ASCVD risk score (Arnett DK, et al., 2019) is: 1.2%     Assessment & Plan:   Problem List Items Addressed This Visit       Cardiovascular and Mediastinum   Hypertension associated with diabetes (HCC)    Chronic, stable.  BP today 116/78.  Continue lisinopril 10 mg daily.  Check CMP, CBC, lipid panel.  Follow-up in 6 months.      Relevant Medications   lisinopril (ZESTRIL) 10 MG tablet     Endocrine   Uncontrolled type 2 diabetes mellitus with hyperglycemia (HCC) - Primary    Chronic, not controlled.  Most recent A1c in January 2024 was 8.1% she is currently following with endocrinology and is currently taking Semglee 30 units daily and Humalog 5 to 8 units 3 times a day with meals.  She states that her sugars were doing really well until she was on prednisone for recent sinus and ear infection.  Since then her blood sugars have been 200s fasting and then 170s to 190s in the afternoons.  They are starting to come back down, however are still more elevated than they were before.  She is also taking metformin 1000 mg twice a day.  Foot exam normal today.  Discussed getting a yearly eye exam.  Also recommended pneumonia vaccine.  Will check CMP, CBC, lipid panel today.  She has a follow-up appointment with endocrinology next month and will have her A1c checked then.  Follow-up in 6 months.      Relevant Medications   lisinopril (ZESTRIL) 10 MG tablet   Other Relevant Orders   CBC   Comprehensive metabolic panel   Lipid panel   Microalbumin / creatinine urine ratio     Other   Anxiety    Chronic, not controlled.  She is currently taking Wellbutrin 150 mg daily, however she feels like her anxiety has slightly worsened recently.  She is also been slightly more tearful, however does deny depression.  Her PHQ-9 is a 4 and her GAD-7 is a 5.  She denies SI/HI.  Will increase her Wellbutrin to 300 mg daily.  She has taken Lexapro and Prozac in the past, did discuss possibly starting Prozac again in addition to the Wellbutrin.  Will see how she does  first on just increasing the Wellbutrin dose.  Follow-up in 6 months or sooner with concerns.      Relevant  Medications   buPROPion (WELLBUTRIN XL) 300 MG 24 hr tablet    Return in about 6 months (around 02/13/2023) for CPE.    Gerre Scull, NP

## 2022-08-13 NOTE — Assessment & Plan Note (Signed)
Chronic, stable.  BP today 116/78.  Continue lisinopril 10 mg daily.  Check CMP, CBC, lipid panel.  Follow-up in 6 months.

## 2022-09-09 ENCOUNTER — Other Ambulatory Visit: Payer: Self-pay | Admitting: Nurse Practitioner

## 2022-09-09 ENCOUNTER — Encounter: Payer: Self-pay | Admitting: Nurse Practitioner

## 2022-09-09 NOTE — Telephone Encounter (Signed)
Pt left a VM to cancel her appt for tomorrow. She did not reschedule

## 2022-09-10 ENCOUNTER — Ambulatory Visit: Payer: BC Managed Care – PPO | Admitting: Nurse Practitioner

## 2022-09-26 ENCOUNTER — Other Ambulatory Visit: Payer: Self-pay | Admitting: Family

## 2022-09-26 ENCOUNTER — Encounter: Payer: Self-pay | Admitting: Nurse Practitioner

## 2022-09-26 DIAGNOSIS — H9202 Otalgia, left ear: Secondary | ICD-10-CM

## 2022-09-26 MED ORDER — BUPROPION HCL ER (XL) 300 MG PO TB24: 300.0000 mg | ORAL_TABLET | Freq: Every day | ORAL | 1 refills | Status: DC

## 2022-09-26 MED ORDER — METFORMIN HCL 1000 MG PO TABS: 1000.0000 mg | ORAL_TABLET | Freq: Two times a day (BID) | ORAL | 1 refills | Status: DC

## 2022-09-26 MED ORDER — INSULIN LISPRO (1 UNIT DIAL) 100 UNIT/ML (KWIKPEN): 5.0000 [IU] | PEN_INJECTOR | Freq: Three times a day (TID) | SUBCUTANEOUS | 3 refills | Status: AC

## 2022-09-26 MED ORDER — LISINOPRIL 10 MG PO TABS: 10.0000 mg | ORAL_TABLET | Freq: Every day | ORAL | 1 refills | Status: DC

## 2022-09-26 MED ORDER — INSULIN GLARGINE-YFGN 100 UNIT/ML ~~LOC~~ SOPN: 30.0000 [IU] | PEN_INJECTOR | Freq: Every day | SUBCUTANEOUS | 1 refills | Status: DC

## 2022-10-04 ENCOUNTER — Ambulatory Visit (INDEPENDENT_AMBULATORY_CARE_PROVIDER_SITE_OTHER): Payer: BC Managed Care – PPO | Admitting: Nurse Practitioner

## 2022-10-04 ENCOUNTER — Encounter: Payer: Self-pay | Admitting: Nurse Practitioner

## 2022-10-04 VITALS — BP 126/80 | HR 105 | Temp 98.2°F | Resp 16 | Ht 63.0 in | Wt 182.8 lb

## 2022-10-04 DIAGNOSIS — E1165 Type 2 diabetes mellitus with hyperglycemia: Secondary | ICD-10-CM

## 2022-10-04 DIAGNOSIS — Z7984 Long term (current) use of oral hypoglycemic drugs: Secondary | ICD-10-CM | POA: Diagnosis not present

## 2022-10-04 DIAGNOSIS — H6992 Unspecified Eustachian tube disorder, left ear: Secondary | ICD-10-CM

## 2022-10-04 MED ORDER — FLUTICASONE PROPIONATE 50 MCG/ACT NA SUSP: 2.0000 | Freq: Every day | NASAL | 0 refills | Status: DC

## 2022-10-04 MED ORDER — INSULIN GLARGINE-YFGN 100 UNIT/ML ~~LOC~~ SOPN: 35.0000 [IU] | PEN_INJECTOR | Freq: Every day | SUBCUTANEOUS | 1 refills | Status: DC

## 2022-10-04 NOTE — Assessment & Plan Note (Signed)
Chronic, not controlled. Blood sugars are still elevated in the 200-250 range at home. Will increase semglee insulin to 35 units daily and keep doing humalog 5-8 units with meals. Keep next appointment next week to follow-up on sugars.

## 2022-10-04 NOTE — Patient Instructions (Signed)
It was great to see you!  Increase your semglee to 35 units daily. Keep taking the humalog 5-8 units with meals.   Start flonase nasal spray 2 sprays in each nostril daily.  Start cetirizine (zyrtec) or loratadine (claritin) 10mg  daily.   Let's follow-up at your next appointment  Take care,  Rodman Pickle, NP

## 2022-10-04 NOTE — Progress Notes (Signed)
Acute Office Visit  Subjective:     Patient ID: Jessica Li, female    DOB: 1975/11/29, 46 y.o.   MRN: [id number]  Chief Complaint  Patient presents with   Otitis Media    Left ear pain and dizziness.  Ear pain started Thursday but started getting dizzy this am    HPI Patient is in today for ear pain and dizziness.  She has been having left ear pain that started Wednesday, it is slightly better today. She has also been having some dizziness that started this morning, especially when she is sitting. She recently had allergies and a cough and has been taking mucinex. She denies headaches, fevers, and sick contacts.    She also notes that her blood sugars have been elevated in the 200s-250s. She is still taking semglee insulin 30 units daily and humalog 5-8 units with meals. She denies chest pain and shortness of breath.   ROS See pertinent positives and negatives per HPI.     Objective:    BP 126/80 (BP Location: Left Arm, Patient Position: Sitting, Cuff Size: Normal)   Pulse (!) 105   Temp 98.2 F (36.8 C) (Oral)   Resp 16   Ht 5\' 3"  (1.6 m)   Wt 182 lb 12.8 oz (82.9 kg)   SpO2 98%   BMI 32.38 kg/m    Physical Exam Vitals and nursing note reviewed.  Constitutional:      General: She is not in acute distress.    Appearance: Normal appearance.  HENT:     Head: Normocephalic.     Right Ear: Ear canal and external ear normal. A middle ear effusion is present.     Left Ear: Tympanic membrane, ear canal and external ear normal.     Mouth/Throat:     Mouth: Mucous membranes are moist.     Pharynx: No posterior oropharyngeal erythema.  Eyes:     Conjunctiva/sclera: Conjunctivae normal.  Cardiovascular:     Rate and Rhythm: Normal rate and regular rhythm.     Pulses: Normal pulses.     Heart sounds: Normal heart sounds.  Pulmonary:     Effort: Pulmonary effort is normal.     Breath sounds: Normal breath sounds.  Musculoskeletal:     Cervical back: Normal range  of motion and neck supple. No tenderness.  Lymphadenopathy:     Cervical: No cervical adenopathy.  Skin:    General: Skin is warm.  Neurological:     General: No focal deficit present.     Mental Status: She is alert and oriented to person, place, and time.     Comments: Dix-hallpike maneuver negative bilaterally   Psychiatric:        Mood and Affect: Mood normal.        Behavior: Behavior normal.        Thought Content: Thought content normal.        Judgment: Judgment normal.       Assessment & Plan:   Problem List Items Addressed This Visit       Endocrine   Uncontrolled type 2 diabetes mellitus with hyperglycemia (HCC)    Chronic, not controlled. Blood sugars are still elevated in the 200-250 range at home. Will increase semglee insulin to 35 units daily and keep doing humalog 5-8 units with meals. Keep next appointment next week to follow-up on sugars.       Relevant Medications   insulin glargine-yfgn (SEMGLEE, YFGN,) 100 UNIT/ML Pen  Other   Long term current use of oral hypoglycemic drug   Other Visit Diagnoses     Dysfunction of left eustachian tube    -  Primary   Start flonase nasal spray and loratadine or cetirizine 10mg  daily. Encourage fluids. Try to hold off on prednisone d/t uncontrolled blood sugars.       Meds ordered this encounter  Medications   insulin glargine-yfgn (SEMGLEE, YFGN,) 100 UNIT/ML Pen    Sig: Inject 35 Units into the skin at bedtime.    Dispense:  27 mL    Refill:  1   fluticasone (FLONASE) 50 MCG/ACT nasal spray    Sig: Place 2 sprays into both nostrils daily.    Dispense:  16 g    Refill:  0    Return if symptoms worsen or fail to improve.  Gerre Scull, NP

## 2022-10-10 ENCOUNTER — Ambulatory Visit: Payer: BC Managed Care – PPO | Admitting: Nurse Practitioner

## 2022-10-30 ENCOUNTER — Encounter (INDEPENDENT_AMBULATORY_CARE_PROVIDER_SITE_OTHER): Payer: Self-pay

## 2022-11-13 ENCOUNTER — Ambulatory Visit: Payer: BC Managed Care – PPO | Admitting: Nurse Practitioner

## 2022-11-14 ENCOUNTER — Encounter (INDEPENDENT_AMBULATORY_CARE_PROVIDER_SITE_OTHER): Payer: Self-pay

## 2022-12-10 ENCOUNTER — Ambulatory Visit: Payer: BC Managed Care – PPO | Admitting: Nurse Practitioner

## 2023-01-09 ENCOUNTER — Ambulatory Visit: Payer: BC Managed Care – PPO | Admitting: Nurse Practitioner

## 2023-01-23 ENCOUNTER — Ambulatory Visit: Payer: BC Managed Care – PPO | Admitting: Nurse Practitioner

## 2023-01-23 ENCOUNTER — Encounter: Payer: Self-pay | Admitting: Nurse Practitioner

## 2023-01-23 VITALS — BP 118/80 | HR 98 | Temp 97.9°F | Ht 63.0 in | Wt 194.6 lb

## 2023-01-23 DIAGNOSIS — Z23 Encounter for immunization: Secondary | ICD-10-CM

## 2023-01-23 DIAGNOSIS — Z7985 Long-term (current) use of injectable non-insulin antidiabetic drugs: Secondary | ICD-10-CM | POA: Diagnosis not present

## 2023-01-23 DIAGNOSIS — E1169 Type 2 diabetes mellitus with other specified complication: Secondary | ICD-10-CM | POA: Diagnosis not present

## 2023-01-23 DIAGNOSIS — Z7984 Long term (current) use of oral hypoglycemic drugs: Secondary | ICD-10-CM | POA: Diagnosis not present

## 2023-01-23 DIAGNOSIS — E1165 Type 2 diabetes mellitus with hyperglycemia: Secondary | ICD-10-CM

## 2023-01-23 DIAGNOSIS — Z1211 Encounter for screening for malignant neoplasm of colon: Secondary | ICD-10-CM

## 2023-01-23 DIAGNOSIS — E785 Hyperlipidemia, unspecified: Secondary | ICD-10-CM | POA: Diagnosis not present

## 2023-01-23 LAB — CBC WITH DIFFERENTIAL/PLATELET
Basophils Absolute: 0.1 10*3/uL (ref 0.0–0.1)
Basophils Relative: 1 % (ref 0.0–3.0)
Eosinophils Absolute: 0.2 10*3/uL (ref 0.0–0.7)
Eosinophils Relative: 2.4 % (ref 0.0–5.0)
HCT: 32.1 % — ABNORMAL LOW (ref 36.0–46.0)
Hemoglobin: 9.8 g/dL — ABNORMAL LOW (ref 12.0–15.0)
Lymphocytes Relative: 20.2 % (ref 12.0–46.0)
Lymphs Abs: 1.6 10*3/uL (ref 0.7–4.0)
MCHC: 30.6 g/dL (ref 30.0–36.0)
MCV: 71.2 fL — ABNORMAL LOW (ref 78.0–100.0)
Monocytes Absolute: 0.6 10*3/uL (ref 0.1–1.0)
Monocytes Relative: 7 % (ref 3.0–12.0)
Neutro Abs: 5.5 10*3/uL (ref 1.4–7.7)
Neutrophils Relative %: 69.4 % (ref 43.0–77.0)
Platelets: 558 10*3/uL — ABNORMAL HIGH (ref 150.0–400.0)
RBC: 4.5 Mil/uL (ref 3.87–5.11)
RDW: 16.1 % — ABNORMAL HIGH (ref 11.5–15.5)
WBC: 7.9 10*3/uL (ref 4.0–10.5)

## 2023-01-23 LAB — LIPID PANEL
Cholesterol: 205 mg/dL — ABNORMAL HIGH (ref 0–200)
HDL: 57.2 mg/dL (ref >39.00)
LDL Cholesterol: 127 mg/dL — ABNORMAL HIGH (ref 0–99)
NonHDL: 148.26
Total CHOL/HDL Ratio: 4
Triglycerides: 104 mg/dL (ref 0.0–149.0)
VLDL: 20.8 mg/dL (ref 0.0–40.0)

## 2023-01-23 LAB — COMPREHENSIVE METABOLIC PANEL
ALT: 9 U/L (ref 0–35)
AST: 10 U/L (ref 0–37)
Albumin: 3.9 g/dL (ref 3.5–5.2)
Alkaline Phosphatase: 65 U/L (ref 39–117)
BUN: 12 mg/dL (ref 6–23)
CO2: 25 meq/L (ref 19–32)
Calcium: 9.3 mg/dL (ref 8.4–10.5)
Chloride: 102 meq/L (ref 96–112)
Creatinine, Ser: 0.56 mg/dL (ref 0.40–1.20)
GFR: 108.39 mL/min (ref >60.00)
Glucose, Bld: 221 mg/dL — ABNORMAL HIGH (ref 70–99)
Potassium: 4.1 meq/L (ref 3.5–5.1)
Sodium: 135 meq/L (ref 135–145)
Total Bilirubin: 0.4 mg/dL (ref 0.2–1.2)
Total Protein: 6.9 g/dL (ref 6.0–8.3)

## 2023-01-23 LAB — HEMOGLOBIN A1C: Hgb A1c MFr Bld: 11 % — ABNORMAL HIGH (ref 4.6–6.5)

## 2023-01-23 MED ORDER — OZEMPIC (0.25 OR 0.5 MG/DOSE) 2 MG/1.5ML ~~LOC~~ SOPN: 0.2500 mg | PEN_INJECTOR | SUBCUTANEOUS | 1 refills | Status: DC

## 2023-01-23 MED ORDER — LISINOPRIL 10 MG PO TABS: 10.0000 mg | ORAL_TABLET | Freq: Every day | ORAL | 1 refills | Status: DC

## 2023-01-23 MED ORDER — INSULIN GLARGINE-YFGN 100 UNIT/ML ~~LOC~~ SOPN: 35.0000 [IU] | PEN_INJECTOR | Freq: Every day | SUBCUTANEOUS | 0 refills | Status: DC

## 2023-01-23 NOTE — Assessment & Plan Note (Signed)
Her last cholesterol level was slightly above target at 119 mg/dL, indicating an increased risk of cardiovascular disease, especially with concurrent diabetes. We discussed the potential benefits of starting a cholesterol-lowering medication. Today, we will check cholesterol levels and consider initiating a cholesterol-lowering medication at the next visit depending on the results.

## 2023-01-23 NOTE — Assessment & Plan Note (Signed)
Continue metformin 1,000 mg BID

## 2023-01-23 NOTE — Progress Notes (Signed)
Established Patient Office Visit  Subjective   Patient ID: Jessica Li, female    DOB: 1975-08-19  Age: 47 y.o. MRN: [id number]  Chief Complaint  Patient presents with   Diabetes    Follow up with Rx refills, patient is fasting, flu vaccine   HPI  Discussed the use of AI scribe software for clinical note transcription with the patient, who gave verbal consent to proceed.  History of Present Illness   The patient, with a history of diabetes, presents with concerns about recent weight gain of approximately 12 pounds since her last visit in July. She reports no changes in her diet or exercise routine. She has recently resumed her exercise routine in an attempt to manage her weight. The patient also reports poor glycemic control, acknowledging that her blood sugars have not been well managed recently. She attributes this to a high level of stress. Despite her concerns about her blood sugar levels, she expresses more concern about her weight gain and the fit of her clothes.  The patient also discusses her diabetes management, currently on insulin and metformin. She expresses interest in potentially starting Ozempic or Mounjaro, with a preference for Ozempic due to its potential weight loss benefits. She is aware of potential side effects, including gastrointestinal symptoms and ketoacidosis, and is willing to monitor her blood sugars closely if she starts this medication.  In addition to her diabetes and weight concerns, the patient also discusses her cholesterol levels. Her last cholesterol level was slightly above the desired level, and she is open to rechecking her cholesterol levels and potentially starting a cholesterol-lowering medication if necessary.       ROS See pertinent positives and negatives per HPI.    Objective:     BP 118/80 (BP Location: Left Arm)   Pulse 98   Temp 97.9 F (36.6 C)   Ht 5\' 3"  (1.6 m)   Wt 194 lb 9.6 oz (88.3 kg)   LMP 01/11/2023 (Approximate)    SpO2 99%   BMI 34.47 kg/m  BP Readings from Last 3 Encounters:  01/23/23 118/80  10/04/22 126/80  08/13/22 116/78   Wt Readings from Last 3 Encounters:  01/23/23 194 lb 9.6 oz (88.3 kg)  10/04/22 182 lb 12.8 oz (82.9 kg)  08/13/22 183 lb 6.4 oz (83.2 kg)      Physical Exam Vitals and nursing note reviewed.  Constitutional:      General: She is not in acute distress.    Appearance: Normal appearance.  HENT:     Head: Normocephalic.  Eyes:     Conjunctiva/sclera: Conjunctivae normal.  Cardiovascular:     Rate and Rhythm: Normal rate and regular rhythm.     Pulses: Normal pulses.     Heart sounds: Normal heart sounds.  Pulmonary:     Effort: Pulmonary effort is normal.     Breath sounds: Normal breath sounds.  Musculoskeletal:     Cervical back: Normal range of motion.  Skin:    General: Skin is warm.  Neurological:     General: No focal deficit present.     Mental Status: She is alert and oriented to person, place, and time.  Psychiatric:        Mood and Affect: Mood normal.        Behavior: Behavior normal.        Thought Content: Thought content normal.        Judgment: Judgment normal.    The 10-year ASCVD risk score (Arnett  DK, et al., 2019) is: 1.3%    Assessment & Plan:   Problem List Items Addressed This Visit       Endocrine   Uncontrolled type 2 diabetes mellitus with hyperglycemia (HCC) - Primary    Her Type 2 Diabetes Mellitus is poorly controlled, evidenced by recent weight gain and inconsistent exercise. We discussed the impact of stress, diet, and exercise on blood glucose control and considered the addition of Ozempic for its benefits in weight loss and improved glucose control, alongside potential side effects. We will start Ozempic 0.25 mg injection weekly for 4 weeks, then increase to 0.5 mg weekly, while continuing her current insulin regimen and closely monitoring blood glucose levels. Today, we will check A1C, CMP, CBC.      Relevant  Medications   Semaglutide,0.25 or 0.5MG /DOS, (OZEMPIC, 0.25 OR 0.5 MG/DOSE,) 2 MG/1.5ML SOPN   lisinopril (ZESTRIL) 10 MG tablet   insulin glargine-yfgn (SEMGLEE, YFGN,) 100 UNIT/ML Pen   Other Relevant Orders   CBC with Differential/Platelet   Comprehensive metabolic panel   Hemoglobin A1c   Lipid panel   Hyperlipidemia associated with type 2 diabetes mellitus (HCC)    Her last cholesterol level was slightly above target at 119 mg/dL, indicating an increased risk of cardiovascular disease, especially with concurrent diabetes. We discussed the potential benefits of starting a cholesterol-lowering medication. Today, we will check cholesterol levels and consider initiating a cholesterol-lowering medication at the next visit depending on the results.          Relevant Medications   Semaglutide,0.25 or 0.5MG /DOS, (OZEMPIC, 0.25 OR 0.5 MG/DOSE,) 2 MG/1.5ML SOPN   lisinopril (ZESTRIL) 10 MG tablet   insulin glargine-yfgn (SEMGLEE, YFGN,) 100 UNIT/ML Pen   Other Relevant Orders   Lipid panel     Other   Long term current use of oral hypoglycemic drug    Continue metformin 1,000mg  BID      Long-term current use of injectable noninsulin antidiabetic medication    Start ozempic 0.25mg  injection weekly for 4 weeks, then increase to 0.5mg  injection weekly       Other Visit Diagnoses     Screen for colon cancer       Cologuard ordered today   Relevant Orders   Cologuard   Immunization due       Flu vaccine given today   Relevant Orders   Flu vaccine trivalent PF, 6mos and older(Flulaval,Afluria,Fluarix,Fluzone)        Return in about 3 months (around 04/25/2023) for Diabetes.    Gerre Scull, NP

## 2023-01-23 NOTE — Assessment & Plan Note (Signed)
Start ozempic 0.25mg  injection weekly for 4 weeks, then increase to 0.5mg  injection weekly.

## 2023-01-23 NOTE — Assessment & Plan Note (Signed)
Her Type 2 Diabetes Mellitus is poorly controlled, evidenced by recent weight gain and inconsistent exercise. We discussed the impact of stress, diet, and exercise on blood glucose control and considered the addition of Ozempic for its benefits in weight loss and improved glucose control, alongside potential side effects. We will start Ozempic 0.25 mg injection weekly for 4 weeks, then increase to 0.5 mg weekly, while continuing her current insulin regimen and closely monitoring blood glucose levels. Today, we will check A1C, CMP, CBC.

## 2023-01-23 NOTE — Patient Instructions (Signed)
VISIT SUMMARY:  During today's visit, we discussed your recent weight gain, poor blood sugar control, and potential premenopausal symptoms. We also reviewed your history of diabetes and hyperlipidemia, and planned for general health maintenance.  YOUR PLAN:  -TYPE 2 DIABETES MELLITUS: Type 2 Diabetes Mellitus is a condition where your body does not use insulin properly, leading to high blood sugar levels. We discussed the impact of stress, diet, and exercise on your condition and decided to start you on Ozempic injections to help with weight loss and better blood sugar control. You will begin with 0.25 mg weekly for 4 weeks, then increase to 0.5 mg weekly. Continue your current insulin regimen and monitor your blood sugars closely for low blood sugar. We will also check your A1C, CMP, and CBC today.  -HYPERLIPIDEMIA: Hyperlipidemia is when you have high levels of fats in your blood, which can increase your risk of heart disease, especially with diabetes. Your last cholesterol levels were slightly above the target. We will check your cholesterol levels today and may start you on a cholesterol-lowering medication at your next visit based on the results.  -GENERAL HEALTH MAINTENANCE: For your general health, we will order a new Cologuard kit for colorectal cancer screening and give you the flu vaccine today. You have an eye exam scheduled in 2-3 weeks. We will follow up in 3 months to reassess your cholesterol levels and how you are responding to Ozempic.  INSTRUCTIONS:  Please follow up in 3 months to reassess your cholesterol levels and the response to Ozempic. Continue monitoring your blood sugars closely and report any instances of low blood sugar. Attend your scheduled eye exam in 2-3 weeks.

## 2023-02-18 ENCOUNTER — Other Ambulatory Visit: Payer: Self-pay | Admitting: Family

## 2023-02-18 ENCOUNTER — Encounter: Payer: Self-pay | Admitting: Nurse Practitioner

## 2023-02-24 ENCOUNTER — Other Ambulatory Visit: Payer: Self-pay

## 2023-02-24 MED ORDER — INSULIN GLARGINE-YFGN 100 UNIT/ML ~~LOC~~ SOPN: 35.0000 [IU] | PEN_INJECTOR | Freq: Every day | SUBCUTANEOUS | 0 refills | Status: DC

## 2023-02-24 NOTE — Telephone Encounter (Signed)
Requesting: SEMGLEE 100U/ML PEN Last Visit: 01/23/2023 Next Visit: Visit date not found Last Refill: 01/23/2023  Please Advise

## 2023-04-01 ENCOUNTER — Encounter: Payer: Self-pay | Admitting: Nurse Practitioner

## 2023-04-03 MED ORDER — SEMAGLUTIDE (1 MG/DOSE) 4 MG/3ML ~~LOC~~ SOPN: 1.0000 mg | PEN_INJECTOR | SUBCUTANEOUS | 1 refills | Status: DC

## 2023-05-08 DIAGNOSIS — Z1231 Encounter for screening mammogram for malignant neoplasm of breast: Secondary | ICD-10-CM | POA: Diagnosis not present

## 2023-05-08 LAB — HM MAMMOGRAPHY

## 2023-05-09 ENCOUNTER — Encounter: Payer: Self-pay | Admitting: Nurse Practitioner

## 2023-05-19 ENCOUNTER — Telehealth: Payer: Self-pay

## 2023-05-19 NOTE — Telephone Encounter (Signed)
 Patient was identified as falling into the [place] Measure - Diabetes.   Patient was: Left voicemail to schedule with primary care provider.

## 2023-05-23 ENCOUNTER — Other Ambulatory Visit: Payer: Self-pay | Admitting: Nurse Practitioner

## 2023-05-27 ENCOUNTER — Encounter: Payer: Self-pay | Admitting: Nurse Practitioner

## 2023-05-28 NOTE — Telephone Encounter (Signed)
 Called patient to schedule appointment to address concerns; left vm for call back

## 2023-05-30 ENCOUNTER — Other Ambulatory Visit: Payer: Self-pay | Admitting: Nurse Practitioner

## 2023-06-09 DIAGNOSIS — Z1211 Encounter for screening for malignant neoplasm of colon: Secondary | ICD-10-CM | POA: Diagnosis not present

## 2023-06-12 ENCOUNTER — Other Ambulatory Visit: Payer: Self-pay | Admitting: Family

## 2023-06-13 ENCOUNTER — Ambulatory Visit: Payer: BC Managed Care – PPO | Admitting: Nurse Practitioner

## 2023-06-15 LAB — COLOGUARD: COLOGUARD: NEGATIVE

## 2023-06-16 ENCOUNTER — Encounter: Payer: Self-pay | Admitting: Nurse Practitioner

## 2023-06-18 ENCOUNTER — Other Ambulatory Visit: Payer: Self-pay

## 2023-06-18 MED ORDER — METFORMIN HCL 1000 MG PO TABS: 1000.0000 mg | ORAL_TABLET | Freq: Two times a day (BID) | ORAL | 1 refills | Status: DC

## 2023-06-18 NOTE — Telephone Encounter (Signed)
 Requesting: Metformin 1000mg  Last Visit: 01/23/23 Next Visit: 07/01/23 Last Refill: 09/26/22  Please Advise

## 2023-06-21 ENCOUNTER — Other Ambulatory Visit: Payer: Self-pay | Admitting: Nurse Practitioner

## 2023-06-23 NOTE — Telephone Encounter (Signed)
 Requesting: Ozempic (1 MG/DOSE) 4 MG/3ML Subcutaneous Solution Pen-injector  Last Visit: 01/23/2023 Next Visit: 07/01/2023 Last Refill: 05/23/2023  Please Advise

## 2023-06-27 NOTE — Telephone Encounter (Signed)
 Follow up call. HM is overdue for cervical screening and diabatic eye exam. Called Pt to obtain information and request records if needed; left VM for call back. Pt also has an upcoming appointment on 07/01/2023 with Rodman Pickle NP. HM can be addressed at visit.

## 2023-07-01 ENCOUNTER — Ambulatory Visit: Payer: BC Managed Care – PPO | Admitting: Nurse Practitioner

## 2023-07-01 ENCOUNTER — Encounter: Payer: Self-pay | Admitting: Nurse Practitioner

## 2023-07-01 VITALS — BP 108/76 | HR 94 | Temp 96.9°F | Ht 63.0 in | Wt 179.2 lb

## 2023-07-01 DIAGNOSIS — E1159 Type 2 diabetes mellitus with other circulatory complications: Secondary | ICD-10-CM | POA: Diagnosis not present

## 2023-07-01 DIAGNOSIS — Z7984 Long term (current) use of oral hypoglycemic drugs: Secondary | ICD-10-CM | POA: Diagnosis not present

## 2023-07-01 DIAGNOSIS — D649 Anemia, unspecified: Secondary | ICD-10-CM | POA: Insufficient documentation

## 2023-07-01 DIAGNOSIS — E1165 Type 2 diabetes mellitus with hyperglycemia: Secondary | ICD-10-CM

## 2023-07-01 DIAGNOSIS — I152 Hypertension secondary to endocrine disorders: Secondary | ICD-10-CM

## 2023-07-01 DIAGNOSIS — Z7985 Long-term (current) use of injectable non-insulin antidiabetic drugs: Secondary | ICD-10-CM

## 2023-07-01 LAB — LIPID PANEL
Cholesterol: 178 mg/dL (ref 0–200)
HDL: 48.5 mg/dL (ref >39.00)
LDL Cholesterol: 106 mg/dL — ABNORMAL HIGH (ref 0–99)
NonHDL: 129.08
Total CHOL/HDL Ratio: 4
Triglycerides: 114 mg/dL (ref 0.0–149.0)
VLDL: 22.8 mg/dL (ref 0.0–40.0)

## 2023-07-01 LAB — CBC WITH DIFFERENTIAL/PLATELET
Basophils Absolute: 0 10*3/uL (ref 0.0–0.1)
Basophils Relative: 0.5 % (ref 0.0–3.0)
Eosinophils Absolute: 0.3 10*3/uL (ref 0.0–0.7)
Eosinophils Relative: 3.6 % (ref 0.0–5.0)
HCT: 30.2 % — ABNORMAL LOW (ref 36.0–46.0)
Hemoglobin: 9.5 g/dL — ABNORMAL LOW (ref 12.0–15.0)
Lymphocytes Relative: 22.8 % (ref 12.0–46.0)
Lymphs Abs: 1.8 10*3/uL (ref 0.7–4.0)
MCHC: 31.5 g/dL (ref 30.0–36.0)
MCV: 68.9 fl — ABNORMAL LOW (ref 78.0–100.0)
Monocytes Absolute: 0.6 10*3/uL (ref 0.1–1.0)
Monocytes Relative: 7.4 % (ref 3.0–12.0)
Neutro Abs: 5.3 10*3/uL (ref 1.4–7.7)
Neutrophils Relative %: 65.7 % (ref 43.0–77.0)
Platelets: 630 10*3/uL — ABNORMAL HIGH (ref 150.0–400.0)
RBC: 4.39 Mil/uL (ref 3.87–5.11)
RDW: 16.9 % — ABNORMAL HIGH (ref 11.5–15.5)
WBC: 8.1 10*3/uL (ref 4.0–10.5)

## 2023-07-01 LAB — COMPREHENSIVE METABOLIC PANEL WITH GFR
ALT: 7 U/L (ref 0–35)
AST: 8 U/L (ref 0–37)
Albumin: 4 g/dL (ref 3.5–5.2)
Alkaline Phosphatase: 63 U/L (ref 39–117)
BUN: 9 mg/dL (ref 6–23)
CO2: 26 meq/L (ref 19–32)
Calcium: 9 mg/dL (ref 8.4–10.5)
Chloride: 102 meq/L (ref 96–112)
Creatinine, Ser: 0.52 mg/dL (ref 0.40–1.20)
GFR: 110 mL/min (ref >60.00)
Glucose, Bld: 145 mg/dL — ABNORMAL HIGH (ref 70–99)
Potassium: 4 meq/L (ref 3.5–5.1)
Sodium: 137 meq/L (ref 135–145)
Total Bilirubin: 0.3 mg/dL (ref 0.2–1.2)
Total Protein: 7.2 g/dL (ref 6.0–8.3)

## 2023-07-01 LAB — VITAMIN B12: Vitamin B-12: 191 pg/mL — ABNORMAL LOW (ref 211–911)

## 2023-07-01 LAB — HEMOGLOBIN A1C: Hgb A1c MFr Bld: 9 % — ABNORMAL HIGH (ref 4.6–6.5)

## 2023-07-01 LAB — IRON: Iron: 21 ug/dL — ABNORMAL LOW (ref 42–145)

## 2023-07-01 LAB — FERRITIN: Ferritin: 3.2 ng/mL — ABNORMAL LOW (ref 10.0–291.0)

## 2023-07-01 MED ORDER — ONDANSETRON HCL 4 MG PO TABS: 4.0000 mg | ORAL_TABLET | Freq: Three times a day (TID) | ORAL | 0 refills | Status: AC | PRN

## 2023-07-01 NOTE — Assessment & Plan Note (Signed)
 Continue ozempic 1mg  injection weekly

## 2023-07-01 NOTE — Progress Notes (Signed)
 Established Patient Office Visit  Subjective   Patient ID: Jessica Li, female    DOB: Feb 28, 1976  Age: 48 y.o. MRN: [id number]  Chief Complaint  Patient presents with   Diabetes    Follow up, no concerns    HPI Discussed the use of AI scribe software for clinical note transcription with the patient, who gave verbal consent to proceed.  History of Present Illness   The patient, with a history of diabetes, presents with intermittent severe stomach pain and mild nausea that occurs a couple of days after her weekly Ozempic injection. The discomfort is not associated with any specific food intake and the patient maintains good hydration. The frequency of these episodes is variable, with the most recent severe episode occurring two weeks ago. The patient also reports occasional lightheadedness upon standing in the past two months.  The patient's blood glucose control has improved, with morning readings around 170, which is high but improved for the patient. The patient has lost 15 pounds since the last visit, which has likely contributed to the improved blood glucose control.       ROS See pertinent positives and negatives per HPI.    Objective:     BP 108/76 (BP Location: Left Arm, Patient Position: Sitting, Cuff Size: Normal)   Pulse 94   Temp (!) 96.9 F (36.1 C)   Ht 5\' 3"  (1.6 m)   Wt 179 lb 3.2 oz (81.3 kg)   LMP 06/23/2023   SpO2 99%   BMI 31.74 kg/m  BP Readings from Last 3 Encounters:  07/01/23 108/76  01/23/23 118/80  10/04/22 126/80   Wt Readings from Last 3 Encounters:  07/01/23 179 lb 3.2 oz (81.3 kg)  01/23/23 194 lb 9.6 oz (88.3 kg)  10/04/22 182 lb 12.8 oz (82.9 kg)      Physical Exam Vitals and nursing note reviewed.  Constitutional:      General: She is not in acute distress.    Appearance: Normal appearance.  HENT:     Head: Normocephalic.  Eyes:     Conjunctiva/sclera: Conjunctivae normal.  Cardiovascular:     Rate and Rhythm: Normal  rate and regular rhythm.     Pulses: Normal pulses.     Heart sounds: Normal heart sounds.  Pulmonary:     Effort: Pulmonary effort is normal.     Breath sounds: Normal breath sounds.  Abdominal:     Palpations: Abdomen is soft.     Tenderness: There is no abdominal tenderness.  Musculoskeletal:     Cervical back: Normal range of motion.  Skin:    General: Skin is warm.  Neurological:     General: No focal deficit present.     Mental Status: She is alert and oriented to person, place, and time.  Psychiatric:        Mood and Affect: Mood normal.        Behavior: Behavior normal.        Thought Content: Thought content normal.        Judgment: Judgment normal.    The 10-year ASCVD risk score (Arnett DK, et al., 2019) is: 1.9%    Assessment & Plan:   Problem List Items Addressed This Visit       Cardiovascular and Mediastinum   Hypertension associated with diabetes (HCC) - Primary   Chronic, stable. Her hypertension is well-controlled on Lisinopril 10 mg, though she occasionally experiences lightheadedness, possibly due to medication and weight loss. Monitor blood pressure and  symptoms. Consider reducing Lisinopril to 5 mg if blood pressure still controlled or have any more episodes of light-headedness.         Endocrine   Uncontrolled type 2 diabetes mellitus with hyperglycemia (HCC)   Chronic, not controlled. Glycemic control remains suboptimal with improved but elevated glucose levels. She experiences side effects from Ozempic and is hesitant to switch to [facility]. Continue Ozempic 1mg  injection weekly, metformin 1,000mg  BID, and Semglee insulin 30 units daily. Prescribe Zofran 4mg  every 8 hours for nausea. Advise increased water intake on the day of and after Ozempic injection. Suggest changing the injection site to the leg. Check CMP, CBC, lipid panel, A1c today. Start statin potentially based on lab results. Discuss potential increase in Ozempic dosage based on lab results.  Encouraged her to schedule a yearly eye exam. Follow-up in 3 months.       Relevant Orders   CBC with Differential/Platelet   Comprehensive metabolic panel with GFR   Lipid panel   Hemoglobin A1c     Other   Long term current use of oral hypoglycemic drug   Continue metformin 1,000mg  BID      Long-term current use of injectable noninsulin antidiabetic medication   Continue ozempic 1mg  injection weekly.       Anemia   Last hemoglobin was 9.8. Will recheck CBC, ferritin, iron, and vitamin B12 today.       Relevant Orders   CBC with Differential/Platelet   Ferritin   Iron   Vitamin B12    Return in about 3 months (around 09/30/2023) for CPE.    Gerre Scull, NP

## 2023-07-01 NOTE — Patient Instructions (Signed)
 It was great to see you!  We are checking your labs today and will let you know the results via mychart/phone.   I will let you know if we need to adjust medications   Make sure you are drinking plenty of water  I have sent in zofran to take every 8 hours as needed for nausea  Let's follow-up in 3 months, sooner if you have concerns.  If a referral was placed today, you will be contacted for an appointment. Please note that routine referrals can sometimes take up to 3-4 weeks to process. Please call our office if you haven't heard anything after this time frame.  Take care,  Rodman Pickle, NP

## 2023-07-01 NOTE — Assessment & Plan Note (Signed)
 Last hemoglobin was 9.8. Will recheck CBC, ferritin, iron, and vitamin B12 today.

## 2023-07-01 NOTE — Assessment & Plan Note (Signed)
 Chronic, stable. Her hypertension is well-controlled on Lisinopril 10 mg, though she occasionally experiences lightheadedness, possibly due to medication and weight loss. Monitor blood pressure and symptoms. Consider reducing Lisinopril to 5 mg if blood pressure still controlled or have any more episodes of light-headedness.

## 2023-07-01 NOTE — Assessment & Plan Note (Signed)
Continue metformin 1,000 mg BID

## 2023-07-01 NOTE — Assessment & Plan Note (Addendum)
 Chronic, not controlled. Glycemic control remains suboptimal with improved but elevated glucose levels. She experiences side effects from Ozempic and is hesitant to switch to [facility]. Continue Ozempic 1mg  injection weekly, metformin 1,000mg  BID, and Semglee insulin 30 units daily. Prescribe Zofran 4mg  every 8 hours for nausea. Advise increased water intake on the day of and after Ozempic injection. Suggest changing the injection site to the leg. Check CMP, CBC, lipid panel, A1c today. Start statin potentially based on lab results. Discuss potential increase in Ozempic dosage based on lab results. Encouraged her to schedule a yearly eye exam. Follow-up in 3 months.

## 2023-07-02 ENCOUNTER — Encounter: Payer: Self-pay | Admitting: Nurse Practitioner

## 2023-07-02 MED ORDER — SEMAGLUTIDE (2 MG/DOSE) 8 MG/3ML ~~LOC~~ SOPN: 2.0000 mg | PEN_INJECTOR | SUBCUTANEOUS | 2 refills | Status: DC

## 2023-07-02 NOTE — Addendum Note (Signed)
 Addended by: Rodman Pickle A on: 07/02/2023 10:23 AM   Modules accepted: Orders

## 2023-09-05 ENCOUNTER — Other Ambulatory Visit: Payer: Self-pay | Admitting: Nurse Practitioner

## 2023-09-09 NOTE — Telephone Encounter (Signed)
 Requesting: Semglee  (yfgn) 100 UNIT/ML Subcutaneous Solution Pen-injector  Last Visit: 07/01/2023 Next Visit: Visit date not found Last Refill: 05/30/2023  Please Advise

## 2023-10-11 ENCOUNTER — Other Ambulatory Visit: Payer: Self-pay | Admitting: Nurse Practitioner

## 2023-11-10 ENCOUNTER — Other Ambulatory Visit: Payer: Self-pay | Admitting: Nurse Practitioner

## 2023-11-10 NOTE — Telephone Encounter (Signed)
 Requesting: Ozempic  (2 MG/DOSE) 8 MG/3ML Subcutaneous Solution Pen-injector  Last Visit: 07/01/2023 Next Visit: Visit date not found Last Refill: 10/13/2023  Please Advise

## 2023-12-03 ENCOUNTER — Other Ambulatory Visit: Payer: Self-pay | Admitting: Nurse Practitioner

## 2023-12-12 ENCOUNTER — Other Ambulatory Visit: Payer: Self-pay | Admitting: Nurse Practitioner

## 2023-12-12 NOTE — Telephone Encounter (Signed)
 Requesting: metFORMIN  HCl 1000 MG Oral Tablet  Last Visit: 07/01/2023 Next Visit: 12/30/2023 Last Refill: 06/18/2023  Please Advise

## 2023-12-30 ENCOUNTER — Other Ambulatory Visit (HOSPITAL_COMMUNITY)
Admission: RE | Admit: 2023-12-30 | Discharge: 2023-12-30 | Disposition: A | Discharge status: Home / Self Care | Source: Ambulatory Visit | Attending: Nurse Practitioner | Admitting: Nurse Practitioner

## 2023-12-30 ENCOUNTER — Ambulatory Visit: Admitting: Nurse Practitioner

## 2023-12-30 ENCOUNTER — Encounter: Payer: Self-pay | Admitting: Nurse Practitioner

## 2023-12-30 VITALS — BP 104/76 | HR 100 | Temp 97.3°F | Ht 63.0 in | Wt 160.2 lb

## 2023-12-30 DIAGNOSIS — E1159 Type 2 diabetes mellitus with other circulatory complications: Secondary | ICD-10-CM

## 2023-12-30 DIAGNOSIS — Z7985 Long-term (current) use of injectable non-insulin antidiabetic drugs: Secondary | ICD-10-CM

## 2023-12-30 DIAGNOSIS — Z23 Encounter for immunization: Secondary | ICD-10-CM | POA: Diagnosis not present

## 2023-12-30 DIAGNOSIS — Z0001 Encounter for general adult medical examination with abnormal findings: Secondary | ICD-10-CM

## 2023-12-30 DIAGNOSIS — Z7984 Long term (current) use of oral hypoglycemic drugs: Secondary | ICD-10-CM

## 2023-12-30 DIAGNOSIS — Z124 Encounter for screening for malignant neoplasm of cervix: Secondary | ICD-10-CM

## 2023-12-30 DIAGNOSIS — F419 Anxiety disorder, unspecified: Secondary | ICD-10-CM

## 2023-12-30 DIAGNOSIS — I152 Hypertension secondary to endocrine disorders: Secondary | ICD-10-CM

## 2023-12-30 DIAGNOSIS — H538 Other visual disturbances: Secondary | ICD-10-CM

## 2023-12-30 DIAGNOSIS — E1165 Type 2 diabetes mellitus with hyperglycemia: Secondary | ICD-10-CM | POA: Diagnosis not present

## 2023-12-30 LAB — COMPREHENSIVE METABOLIC PANEL WITH GFR
ALT: 5 U/L (ref 0–35)
AST: 9 U/L (ref 0–37)
Albumin: 4.2 g/dL (ref 3.5–5.2)
Alkaline Phosphatase: 54 U/L (ref 39–117)
BUN: 8 mg/dL (ref 6–23)
CO2: 26 meq/L (ref 19–32)
Calcium: 9.5 mg/dL (ref 8.4–10.5)
Chloride: 101 meq/L (ref 96–112)
Creatinine, Ser: 0.47 mg/dL (ref 0.40–1.20)
GFR: 112.32 mL/min (ref >60.00)
Glucose, Bld: 110 mg/dL — ABNORMAL HIGH (ref 70–99)
Potassium: 3.8 meq/L (ref 3.5–5.1)
Sodium: 136 meq/L (ref 135–145)
Total Bilirubin: 0.3 mg/dL (ref 0.2–1.2)
Total Protein: 7.8 g/dL (ref 6.0–8.3)

## 2023-12-30 LAB — MICROALBUMIN / CREATININE URINE RATIO
Creatinine,U: 132.1 mg/dL
Microalb Creat Ratio: 9.5 mg/g (ref 0.0–30.0)
Microalb, Ur: 1.3 mg/dL (ref 0.0–1.9)

## 2023-12-30 LAB — CBC WITH DIFFERENTIAL/PLATELET
Basophils Absolute: 0.1 K/uL (ref 0.0–0.1)
Basophils Relative: 0.7 % (ref 0.0–3.0)
Eosinophils Absolute: 0.2 K/uL (ref 0.0–0.7)
Eosinophils Relative: 2.4 % (ref 0.0–5.0)
HCT: 26.4 % — ABNORMAL LOW (ref 36.0–46.0)
Hemoglobin: 8.1 g/dL — ABNORMAL LOW (ref 12.0–15.0)
Lymphocytes Relative: 20 % (ref 12.0–46.0)
Lymphs Abs: 1.8 K/uL (ref 0.7–4.0)
MCHC: 30.7 g/dL (ref 30.0–36.0)
MCV: 63.6 fl — ABNORMAL LOW (ref 78.0–100.0)
Monocytes Absolute: 0.7 K/uL (ref 0.1–1.0)
Monocytes Relative: 7.9 % (ref 3.0–12.0)
Neutro Abs: 6.3 K/uL (ref 1.4–7.7)
Neutrophils Relative %: 69 % (ref 43.0–77.0)
Platelets: 661 K/uL — ABNORMAL HIGH (ref 150.0–400.0)
RBC: 4.15 Mil/uL (ref 3.87–5.11)
RDW: 17.5 % — ABNORMAL HIGH (ref 11.5–15.5)
WBC: 9.1 K/uL (ref 4.0–10.5)

## 2023-12-30 LAB — LIPID PANEL
Cholesterol: 169 mg/dL (ref 0–200)
HDL: 52.1 mg/dL (ref >39.00)
LDL Cholesterol: 97 mg/dL (ref 0–99)
NonHDL: 116.75
Total CHOL/HDL Ratio: 3
Triglycerides: 99 mg/dL (ref 0.0–149.0)
VLDL: 19.8 mg/dL (ref 0.0–40.0)

## 2023-12-30 LAB — HEMOGLOBIN A1C: Hgb A1c MFr Bld: 7.4 % — ABNORMAL HIGH (ref 4.6–6.5)

## 2023-12-30 MED ORDER — FLUOXETINE HCL 20 MG PO CAPS: 20.0000 mg | ORAL_CAPSULE | Freq: Every day | ORAL | 2 refills | Status: DC

## 2023-12-30 NOTE — Assessment & Plan Note (Signed)
 Continue ozempic  1mg  injection weekly

## 2023-12-30 NOTE — Assessment & Plan Note (Addendum)
 Chronic, ongoing. Increased stress due to a new job role with noted emotional lability. Restart fluoxetine (Prozac) 20mg  daily and encourage stress-reducing activities like music therapy. Follow-up in 3 months.

## 2023-12-30 NOTE — Patient Instructions (Addendum)
 It was great to see you!  We are checking your labs today and will let you know the results via mychart/phone.   Start prozac 1 capsule daily   I have placed a referral to an ophthalmologist. Start moisturizing eye drops (not red eye drops) in the mornings  Let's follow-up in 3 months, sooner if you have concerns.  If a referral was placed today, you will be contacted for an appointment. Please note that routine referrals can sometimes take up to 3-4 weeks to process. Please call our office if you haven't heard anything after this time frame.  Take care,  Tinnie Harada, NP

## 2023-12-30 NOTE — Assessment & Plan Note (Signed)
 Chronic, stable. Her hypertension is well-controlled on Lisinopril  10 mg. Check CMP, CBC today.

## 2023-12-30 NOTE — Assessment & Plan Note (Signed)
Continue metformin 1,000 mg BID

## 2023-12-30 NOTE — Assessment & Plan Note (Signed)
 Chronic, ongoing. Continue metformin  1,000mg  BID, ozempic  2mg  weekly, and semglee  insulin  30 units daily. She has not needed humalog . Foot exam normal today. Order CMP, CBC, lipid panel and A1c. Encourage weight management and healthy eating. Follow-up in 3 months.
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
## 2023-12-31 ENCOUNTER — Ambulatory Visit: Payer: Self-pay | Admitting: Nurse Practitioner

## 2023-12-31 ENCOUNTER — Telehealth: Payer: Self-pay | Admitting: Nurse Practitioner

## 2023-12-31 DIAGNOSIS — D75839 Thrombocytosis, unspecified: Secondary | ICD-10-CM

## 2023-12-31 DIAGNOSIS — D649 Anemia, unspecified: Secondary | ICD-10-CM

## 2023-12-31 LAB — CYTOLOGY - PAP
Adequacy: ABSENT
Comment: NEGATIVE
Diagnosis: NEGATIVE
High risk HPV: NEGATIVE

## 2023-12-31 NOTE — Telephone Encounter (Signed)
 Faxed add on to [facility] for iron and ferritin

## 2023-12-31 NOTE — Telephone Encounter (Signed)
 Called patient about lab results. She did not answer, left voice mail to call back and also sent lab results through mychart.

## 2024-01-01 ENCOUNTER — Other Ambulatory Visit

## 2024-01-01 ENCOUNTER — Ambulatory Visit: Payer: Self-pay | Admitting: Nurse Practitioner

## 2024-01-01 DIAGNOSIS — D509 Iron deficiency anemia, unspecified: Secondary | ICD-10-CM | POA: Diagnosis not present

## 2024-01-01 LAB — IRON: Iron: 15 ug/dL — ABNORMAL LOW (ref 42–145)

## 2024-01-01 LAB — FERRITIN: Ferritin: 6.9 ng/mL — ABNORMAL LOW (ref 10.0–291.0)

## 2024-01-01 NOTE — Telephone Encounter (Signed)
 Pt has viewed results via MyChart

## 2024-01-02 ENCOUNTER — Other Ambulatory Visit: Payer: Self-pay | Admitting: Nurse Practitioner

## 2024-01-02 NOTE — Telephone Encounter (Signed)
 Requesting: Ozempic  (2 MG/DOSE) 8 MG/3ML Subcutaneous Solution Pen-injector  Last Visit: 12/30/2023 Next Visit: Visit date not found Last Refill: 12/03/2023  Please Advise

## 2024-01-05 ENCOUNTER — Inpatient Hospital Stay

## 2024-01-05 ENCOUNTER — Encounter: Payer: Self-pay | Admitting: Hematology and Oncology

## 2024-01-05 ENCOUNTER — Inpatient Hospital Stay: Attending: Hematology and Oncology | Admitting: Hematology and Oncology

## 2024-01-05 ENCOUNTER — Telehealth: Payer: Self-pay

## 2024-01-05 VITALS — BP 145/58 | HR 112 | Temp 99.0°F | Resp 18 | Ht 63.0 in | Wt 157.8 lb

## 2024-01-05 DIAGNOSIS — D509 Iron deficiency anemia, unspecified: Secondary | ICD-10-CM | POA: Insufficient documentation

## 2024-01-05 DIAGNOSIS — E538 Deficiency of other specified B group vitamins: Secondary | ICD-10-CM | POA: Insufficient documentation

## 2024-01-05 DIAGNOSIS — Z808 Family history of malignant neoplasm of other organs or systems: Secondary | ICD-10-CM | POA: Insufficient documentation

## 2024-01-05 DIAGNOSIS — D75838 Other thrombocytosis: Secondary | ICD-10-CM | POA: Insufficient documentation

## 2024-01-05 MED ORDER — CYANOCOBALAMIN 1000 MCG/ML IJ SOLN: 1000.0000 ug | INTRAMUSCULAR | 11 refills | Status: AC

## 2024-01-05 NOTE — Telephone Encounter (Signed)
 Dr. Lonn, patient will be scheduled as soon as possible.  Auth Submission: NO AUTH NEEDED Site of care: Site of care: [facility] Payer: [employer] commercial Medication & CPT/J Code(s) submitted: Feraheme (ferumoxytol) [id number] Diagnosis Code:  Route of submission (phone, fax, portal):  Phone # Fax # Auth type: Buy/Bill PB Units/visits requested: 510mg  x 2 doses Reference number:  Approval from: 01/05/24 to 03/31/24

## 2024-01-05 NOTE — Assessment & Plan Note (Signed)
 This is a common side effect related to metformin  and possibly changes in her diet with recent weight loss medicine We discussed importance of vitamin B12 replacement therapy I will prescribe vitamin B12 injection monthly and I recommend she has a labs recheck in 3 months

## 2024-01-05 NOTE — Assessment & Plan Note (Signed)
 Due to severe iron deficiency anemia I anticipate resolution with adequate iron replacement

## 2024-01-05 NOTE — Progress Notes (Signed)
 [facility] CONSULT NOTE  Patient Care Team: Nedra Tinnie LABOR, NP as PCP - General (Internal Medicine)  ASSESSMENT & PLAN:  Iron deficiency anemia The most likely cause of her anemia is due to chronic blood loss/malabsorption syndrome. We discussed some of the risks, benefits, and alternatives of intravenous iron infusions. The patient is symptomatic from anemia and the iron level is critically low. She tolerated oral iron supplement poorly and desires to achieved higher levels of iron faster for adequate hematopoesis. Some of the side-effects to be expected including risks of infusion reactions, phlebitis, headaches, nausea and fatigue.  The patient is willing to proceed. Patient education material was dispensed.  Goal is to keep ferritin level greater than 50 and resolution of anemia  I will order 2 doses of intravenous iron Feraheme She has frequent appointment to see her primary care doctor for diabetes check and I recommend repeat iron studies in 3 months She will notify me of test results  With her severe constipation, I recommend the patient to proceed with colonoscopy rather than reliant on Cologuard  Vitamin B12 deficiency This is a common side effect related to metformin  and possibly changes in her diet with recent weight loss medicine We discussed importance of vitamin B12 replacement therapy I will prescribe vitamin B12 injection monthly and I recommend she has a labs recheck in 3 months  Reactive thrombocytosis Due to severe iron deficiency anemia I anticipate resolution with adequate iron replacement No orders of the defined types were placed in this encounter.   All questions were answered. The patient knows to call the clinic with any problems, questions or concerns.  The total time spent in the appointment was 60 minutes encounter with patients including review of chart and various tests results, discussions about plan of care and coordination of care  plan  Almarie Bedford, MD 10/6/202511:22 AM   CHIEF COMPLAINTS/PURPOSE OF CONSULTATION:  Anemia  HISTORY OF PRESENTING ILLNESS:  Jessica Li 48 y.o. female is here because of anemia  She was found to have abnormal CBC from recent blood work I have the opportunity to review his CBC dated back to 2011 And December 26, 2009, her hemoglobin was normal at 12.5 In 2023, she was noted to be borderline anemic with hemoglobin of 10.7 On January 26, 2022, hemoglobin was 11.7 On Aug 13, 2022, hemoglobin was 12.1 On January 23, 2023, hemoglobin was 9.8, with elevated platelet count of 558 On December 01, 2023, hemoglobin was 9.5 with low MCV and high platelet count of 630 On December 30, 2023, hemoglobin was 8.1, MCV 63.6 and platelet count of 661 Her recent iron studies were grossly abnormal Ferritin was 6.9 Vitamin B12 level on April 1 was also low at 191  She denies recent chest pain on exertion, but has occasional shortness of breath on minimal exertion Denies dizziness She has frequent palpitation She had not noticed any recent bleeding such as epistaxis, hematuria or hematochezia The patient denies over the counter NSAID ingestion. She is not on antiplatelets agents. She has never had colonoscopy done She had no prior history or diagnosis of cancer. Her age appropriate screening programs are up-to-date. She denies any pica and eats a variety of diet, although since she started taking weight loss medicine, her appetite has changed.  She frequently missed breakfast.  Her lunch consists mainly on salad She eats very little meat She never donated blood or received blood transfusion She has severe chronic constipation.  Her bowel habits is  usually once or twice a week She cannot tolerate oral iron supplement due to frequent abdominal pain and cramping  MEDICAL HISTORY:  Past Medical History:  Diagnosis Date   Allergy 2002   Rash   Anxiety 2013   Depression 2004   Diabetes mellitus  without complication (HCC)    Hypertension     SURGICAL HISTORY: Past Surgical History:  Procedure Laterality Date   CESAREAN SECTION      SOCIAL HISTORY: Social History   Socioeconomic History   Marital status: Married    Spouse name: Not on file   Number of children: Not on file   Years of education: Not on file   Highest education level: Some college, no degree  Occupational History   Not on file  Tobacco Use   Smoking status: Never   Smokeless tobacco: Never  Vaping Use   Vaping status: Never Used  Substance and Sexual Activity   Alcohol use: Never   Drug use: Never   Sexual activity: Yes    Birth control/protection: None  Other Topics Concern   Not on file  Social History Narrative   Not on file   Social Drivers of Health   Financial Resource Strain: Low Risk  (12/29/2023)   Overall Financial Resource Strain (CARDIA)    Difficulty of Paying Living Expenses: Not hard at all  Food Insecurity: No Food Insecurity (01/05/2024)   Hunger Vital Sign    Worried About Running Out of Food in the Last Year: Never true    Ran Out of Food in the Last Year: Never true  Transportation Needs: No Transportation Needs (01/05/2024)   PRAPARE - Administrator, Civil Service (Medical): No    Lack of Transportation (Non-Medical): No  Physical Activity: Insufficiently Active (12/29/2023)   Exercise Vital Sign    Days of Exercise per Week: 1 day    Minutes of Exercise per Session: 20 min  Stress: Stress Concern Present (12/29/2023)   [employer] of Occupational Health - Occupational Stress Questionnaire    Feeling of Stress: Rather much  Social Connections: Moderately Integrated (12/29/2023)   Social Connection and Isolation Panel    Frequency of Communication with Friends and Family: Three times a week    Frequency of Social Gatherings with Friends and Family: Once a week    Attends Religious Services: 1 to 4 times per year    Active Member of [employer] or  Organizations: No    Attends Engineer, structural: Not on file    Marital Status: Married  Catering manager Violence: Not At Risk (01/05/2024)   Humiliation, Afraid, Rape, and Kick questionnaire    Fear of Current or Ex-Partner: No    Emotionally Abused: No    Physically Abused: No    Sexually Abused: No    FAMILY HISTORY: Family History  Problem Relation Age of Onset   Heart disease Mother    Diabetes Mother    Heart disease Father    Cancer Sister        melanoma   Diabetes Sister     ALLERGIES:  is allergic to co-trimoxazole [sulfamethoxazole-trimethoprim] and empagliflozin .  MEDICATIONS:  Current Outpatient Medications  Medication Sig Dispense Refill   cyanocobalamin  (VITAMIN B12) 1000 MCG/ML injection Inject 1 mL (1,000 mcg total) into the muscle every 30 (thirty) days. 1 mL 11   FLUoxetine (PROZAC) 20 MG capsule Take 1 capsule (20 mg total) by mouth daily. 30 capsule 2   fluticasone  (FLONASE ) 50 MCG/ACT nasal  spray Place 2 sprays into both nostrils daily. 16 g 0   insulin  lispro (HUMALOG ) 100 UNIT/ML KwikPen Inject 5-8 Units into the skin 3 (three) times daily. 21 mL 3   lisinopril  (ZESTRIL ) 10 MG tablet Take 1 tablet (10 mg total) by mouth daily. 90 tablet 1   metFORMIN  (GLUCOPHAGE ) 1000 MG tablet TAKE 1 TABLET BY MOUTH TWICE DAILY WITH A MEAL 180 tablet 0   ondansetron  (ZOFRAN ) 4 MG tablet Take 1 tablet (4 mg total) by mouth every 8 (eight) hours as needed. 30 tablet 0   Semaglutide , 2 MG/DOSE, (OZEMPIC , 2 MG/DOSE,) 8 MG/3ML SOPN INJECT 2 MG  SUBCUTANEOUSLY ONCE A WEEK 3 mL 2   SEMGLEE , YFGN, 100 UNIT/ML Pen INJECT 35 UNITS SUBCUTANEOUSLY ONE DAILY AT BEDTIME (Patient not taking: Reported on 12/30/2023) 30 mL 0   No current facility-administered medications for this visit.    REVIEW OF SYSTEMS:   Constitutional: Denies fevers, chills or abnormal night sweats Eyes: Denies blurriness of vision, double vision or watery eyes Ears, nose, mouth, throat, and face:  Denies mucositis or sore throat Gastrointestinal:  Denies nausea, heartburn or change in bowel habits Skin: Denies abnormal skin rashes Lymphatics: Denies new lymphadenopathy or easy bruising Neurological:Denies numbness, tingling or new weaknesses Behavioral/Psych: Mood is stable, no new changes  All other systems were reviewed with the patient and are negative.  PHYSICAL EXAMINATION: ECOG PERFORMANCE STATUS: 1 - Symptomatic but completely ambulatory  Vitals:   01/05/24 1033  BP: (!) 145/58  Pulse: (!) 112  Resp: 18  Temp: 99 F (37.2 C)  SpO2: 100%   Filed Weights   01/05/24 1033  Weight: 157 lb 12.8 oz (71.6 kg)    GENERAL:alert, no distress and comfortable.  She looks pale SKIN: skin color, texture, turgor are normal, no rashes or significant lesions EYES: normal, conjunctiva are pink and non-injected, sclera clear OROPHARYNX:no exudate, no erythema and lips, buccal mucosa, and tongue normal  NECK: supple, thyroid normal size, non-tender, without nodularity LYMPH:  no palpable lymphadenopathy in the cervical, axillary or inguinal LUNGS: clear to auscultation and percussion with normal breathing effort HEART: Mild tachycardia, no murmurs and no lower extremity edema ABDOMEN:abdomen soft, non-tender and normal bowel sounds Musculoskeletal:no cyanosis of digits and no clubbing  PSYCH: alert & oriented x 3 with fluent speech NEURO: no focal motor/sensory deficits

## 2024-01-05 NOTE — Assessment & Plan Note (Addendum)
 The most likely cause of her anemia is due to chronic blood loss/malabsorption syndrome. We discussed some of the risks, benefits, and alternatives of intravenous iron infusions. The patient is symptomatic from anemia and the iron level is critically low. She tolerated oral iron supplement poorly and desires to achieved higher levels of iron faster for adequate hematopoesis. Some of the side-effects to be expected including risks of infusion reactions, phlebitis, headaches, nausea and fatigue.  The patient is willing to proceed. Patient education material was dispensed.  Goal is to keep ferritin level greater than 50 and resolution of anemia  I will order 2 doses of intravenous iron Feraheme She has frequent appointment to see her primary care doctor for diabetes check and I recommend repeat iron studies in 3 months She will notify me of test results  With her severe constipation, I recommend the patient to proceed with colonoscopy rather than reliant on Cologuard

## 2024-01-12 ENCOUNTER — Other Ambulatory Visit: Payer: Self-pay | Admitting: Nurse Practitioner

## 2024-01-13 NOTE — Telephone Encounter (Signed)
 Requesting:  Lisinopril  10 MG Oral Tablet  Last Visit: 12/30/2023 Next Visit: Visit date not found Last Refill: 01/23/2023  Please Advise

## 2024-01-15 ENCOUNTER — Ambulatory Visit (INDEPENDENT_AMBULATORY_CARE_PROVIDER_SITE_OTHER)

## 2024-01-15 VITALS — BP 117/74 | HR 73 | Temp 98.8°F | Resp 16 | Ht 63.0 in | Wt 159.4 lb

## 2024-01-15 DIAGNOSIS — D509 Iron deficiency anemia, unspecified: Secondary | ICD-10-CM | POA: Diagnosis not present

## 2024-01-15 MED ORDER — SODIUM CHLORIDE 0.9 % IV SOLN
510.0000 mg | Freq: Once | INTRAVENOUS | Status: AC
  Administered 2024-01-15: 510 mg via INTRAVENOUS
  Filled 2024-01-15: qty 17

## 2024-01-15 MED ORDER — DIPHENHYDRAMINE HCL 25 MG PO CAPS
25.0000 mg | ORAL_CAPSULE | Freq: Once | ORAL | Status: AC
  Administered 2024-01-15: 25 mg via ORAL
  Filled 2024-01-15: qty 1

## 2024-01-15 MED ORDER — ACETAMINOPHEN 325 MG PO TABS
650.0000 mg | ORAL_TABLET | Freq: Once | ORAL | Status: AC
  Administered 2024-01-15: 650 mg via ORAL
  Filled 2024-01-15: qty 2

## 2024-01-15 NOTE — Progress Notes (Signed)
 Diagnosis: Iron Deficiency Anemia  Provider:  Praveen Mannam MD  Procedure: IV Infusion  IV Type: Peripheral, IV Location: R Hand  Feraheme (Ferumoxytol), Dose: 510 mg  Infusion Start Time: 1605  Infusion Stop Time: 1622  Post Infusion IV Care: Observation period completed and Peripheral IV Discontinued  Discharge: Condition: Good, Destination: Home . AVS Declined  Performed by:  Waddell LOISE Freshwater, RN

## 2024-01-22 ENCOUNTER — Ambulatory Visit (INDEPENDENT_AMBULATORY_CARE_PROVIDER_SITE_OTHER)

## 2024-01-22 VITALS — BP 120/72 | HR 78 | Temp 98.9°F | Resp 18 | Ht 63.0 in | Wt 158.0 lb

## 2024-01-22 DIAGNOSIS — D509 Iron deficiency anemia, unspecified: Secondary | ICD-10-CM

## 2024-01-22 MED ORDER — SODIUM CHLORIDE 0.9 % IV SOLN
510.0000 mg | Freq: Once | INTRAVENOUS | Status: AC
  Administered 2024-01-22: 510 mg via INTRAVENOUS
  Filled 2024-01-22: qty 17

## 2024-01-22 MED ORDER — ACETAMINOPHEN 325 MG PO TABS: 650.0000 mg | ORAL_TABLET | Freq: Once | ORAL | Status: DC

## 2024-01-22 MED ORDER — DIPHENHYDRAMINE HCL 25 MG PO CAPS: 25.0000 mg | ORAL_CAPSULE | Freq: Once | ORAL | Status: DC

## 2024-01-22 NOTE — Progress Notes (Signed)
 Diagnosis: Iron Deficiency Anemia  Provider:  Praveen Mannam MD  Procedure: IV Infusion  IV Type: Peripheral, IV Location: R wrist   Feraheme (Ferumoxytol), Dose: 510 mg  Infusion Start Time: 1548  Infusion Stop Time: 1610  Post Infusion IV Care: Patient declined observation and Peripheral IV Discontinued  Discharge: Condition: Good, Destination: Home . AVS Declined  Performed by:  Leita FORBES Miles, LPN

## 2024-01-29 ENCOUNTER — Other Ambulatory Visit: Payer: Self-pay | Admitting: Nurse Practitioner

## 2024-01-30 NOTE — Telephone Encounter (Signed)
 Requesting: LANTUS  SOLOSTAR 100 UNIT/ML Solostar Pen Last Visit: 12/30/2023 Next Visit: Visit date not found Last Refill: 09/21/23  Please Advise

## 2024-03-16 ENCOUNTER — Other Ambulatory Visit: Payer: Self-pay | Admitting: Nurse Practitioner

## 2024-03-16 NOTE — Telephone Encounter (Signed)
 Requesting: metFORMIN  HCl 1000 MG Oral Tablet  Last Visit: 01/29/2024 Next Visit: 04/14/2024 Last Refill: 12/12/2023  Please Advise

## 2024-03-24 ENCOUNTER — Other Ambulatory Visit: Payer: Self-pay | Admitting: Nurse Practitioner

## 2024-03-24 NOTE — Telephone Encounter (Signed)
 Requesting: Ozempic  (2 MG/DOSE) 8 MG/3ML Subcutaneous Solution Pen-injector  Last Visit: 12/30/2023 Next Visit: 04/14/2024 Last Refill: 01/02/2024  Please Advise

## 2024-03-26 ENCOUNTER — Other Ambulatory Visit: Payer: Self-pay | Admitting: Nurse Practitioner

## 2024-03-26 NOTE — Telephone Encounter (Signed)
 Requesting: FLUoxetine  HCl 20 MG Oral Capsule  Last Visit: 12/30/2023 Next Visit: 04/14/2024 Last Refill: 12/30/2023  Please Advise

## 2024-04-14 ENCOUNTER — Encounter: Payer: Self-pay | Admitting: Nurse Practitioner

## 2024-04-14 ENCOUNTER — Ambulatory Visit: Admitting: Nurse Practitioner

## 2024-04-14 VITALS — BP 108/68 | HR 80 | Temp 97.3°F | Ht 63.0 in | Wt 166.2 lb

## 2024-04-14 DIAGNOSIS — E1165 Type 2 diabetes mellitus with hyperglycemia: Secondary | ICD-10-CM

## 2024-04-14 DIAGNOSIS — Z7984 Long term (current) use of oral hypoglycemic drugs: Secondary | ICD-10-CM | POA: Diagnosis not present

## 2024-04-14 DIAGNOSIS — Z7985 Long-term (current) use of injectable non-insulin antidiabetic drugs: Secondary | ICD-10-CM | POA: Diagnosis not present

## 2024-04-14 DIAGNOSIS — I152 Hypertension secondary to endocrine disorders: Secondary | ICD-10-CM | POA: Diagnosis not present

## 2024-04-14 DIAGNOSIS — E1159 Type 2 diabetes mellitus with other circulatory complications: Secondary | ICD-10-CM | POA: Diagnosis not present

## 2024-04-14 DIAGNOSIS — D649 Anemia, unspecified: Secondary | ICD-10-CM

## 2024-04-14 LAB — CBC WITH DIFFERENTIAL/PLATELET
Basophils Absolute: 0 K/uL (ref 0.0–0.1)
Basophils Relative: 0.5 % (ref 0.0–3.0)
Eosinophils Absolute: 0.1 K/uL (ref 0.0–0.7)
Eosinophils Relative: 1.3 % (ref 0.0–5.0)
HCT: 30.6 % — ABNORMAL LOW (ref 36.0–46.0)
Hemoglobin: 10.2 g/dL — ABNORMAL LOW (ref 12.0–15.0)
Lymphocytes Relative: 15.8 % (ref 12.0–46.0)
Lymphs Abs: 1.3 K/uL (ref 0.7–4.0)
MCHC: 33.4 g/dL (ref 30.0–36.0)
MCV: 76.5 fl — ABNORMAL LOW (ref 78.0–100.0)
Monocytes Absolute: 0.5 K/uL (ref 0.1–1.0)
Monocytes Relative: 6.4 % (ref 3.0–12.0)
Neutro Abs: 6.1 K/uL (ref 1.4–7.7)
Neutrophils Relative %: 76 % (ref 43.0–77.0)
Platelets: 547 K/uL — ABNORMAL HIGH (ref 150.0–400.0)
RBC: 4 Mil/uL (ref 3.87–5.11)
RDW: 18.9 % — ABNORMAL HIGH (ref 11.5–15.5)
WBC: 8.1 K/uL (ref 4.0–10.5)

## 2024-04-14 LAB — COMPREHENSIVE METABOLIC PANEL WITH GFR
ALT: 9 U/L (ref 3–35)
AST: 11 U/L (ref 5–37)
Albumin: 4.1 g/dL (ref 3.5–5.2)
Alkaline Phosphatase: 56 U/L (ref 39–117)
BUN: 11 mg/dL (ref 6–23)
CO2: 25 meq/L (ref 19–32)
Calcium: 9.2 mg/dL (ref 8.4–10.5)
Chloride: 101 meq/L (ref 96–112)
Creatinine, Ser: 0.44 mg/dL (ref 0.40–1.20)
GFR: 113.89 mL/min
Glucose, Bld: 79 mg/dL (ref 70–99)
Potassium: 3.9 meq/L (ref 3.5–5.1)
Sodium: 134 meq/L — ABNORMAL LOW (ref 135–145)
Total Bilirubin: 0.2 mg/dL (ref 0.2–1.2)
Total Protein: 7.5 g/dL (ref 6.0–8.3)

## 2024-04-14 LAB — VITAMIN B12: Vitamin B-12: 428 pg/mL (ref 211–911)

## 2024-04-14 LAB — HEMOGLOBIN A1C: Hgb A1c MFr Bld: 6.6 % — ABNORMAL HIGH (ref 4.6–6.5)

## 2024-04-14 MED ORDER — METFORMIN HCL 1000 MG PO TABS: 1000.0000 mg | ORAL_TABLET | Freq: Two times a day (BID) | ORAL | 0 refills | Status: AC

## 2024-04-14 MED ORDER — LISINOPRIL 10 MG PO TABS: 10.0000 mg | ORAL_TABLET | Freq: Every day | ORAL | 3 refills | Status: AC

## 2024-04-14 NOTE — Assessment & Plan Note (Addendum)
 Last hemoglobin was 8.1. Ferritin 6.9. She has received 2 iron transfusion with hematology and is getting B12 injections monthly. Check CBC, iron panel, and B12 today.

## 2024-04-14 NOTE — Assessment & Plan Note (Signed)
Continue metformin 1,000 mg BID

## 2024-04-14 NOTE — Progress Notes (Signed)
 "  Established Patient Office Visit  Subjective  Patient ID: Jessica Li, female    DOB: [birth year]  Age: 49 y.o. MRN: [id number]  Chief Complaint  Patient presents with   Medication Management    Request fasting labs, Rx refill,  no concern    HPI  Jessica Li, who has a history of diabetes, which is managed with Metformin  1000 mg twice daily, Lantus  35 units daily, Jessica Li  2 mg weekly.  Jessica Li weight has remained Li in Jessica higher 150s over Jessica past couple of months. Jessica Li which are good. Jessica Li denies chest pain, shortness of Li, Jessica tingling in Jessica Li hands Jessica feet.   Jessica Li, managed with Lisinopril  10 mg daily. Jessica Li blood pressure has been Li as well, with one instance of elevation on 01/05/2024 at 145/58.   Jessica Li has received 2 iron infusions with hematology Jessica is asking to recheck Jessica Li iron Jessica ferritin today. Jessica Li, blood in Jessica Li stools.    ROS See pertinent positives Jessica negatives per Jessica HPI    Objective:    BP 108/68 (BP Location: Left Arm, Patient Position: Sitting, Cuff Size: Normal)   Pulse 80   Temp (!) 97.3 F (36.3 C)   Ht 5' 3 (1.6 m)   Wt 166 lb 3.2 oz (75.4 kg)   LMP 04/06/2024 (Exact Date)   SpO2 100%   BMI 29.44 kg/m  BP Readings from Last 3 Encounters:  04/14/24 108/68  01/22/24 120/72  01/15/24 117/74   Wt Readings from Last 3 Encounters:  04/14/24 166 lb 3.2 oz (75.4 kg)  01/22/24 158 lb (71.7 kg)  01/15/24 159 lb 6.4 oz (72.3 kg)      Physical Li Vitals Jessica nursing note reviewed.  Constitutional:      General: Jessica Li.    Appearance: Normal appearance.  HENT:     Head: Normocephalic.  Eyes:     Conjunctiva/sclera: Conjunctivae normal.  Cardiovascular:     Rate Jessica Rhythm: Normal rate Jessica regular rhythm.     Pulses: Normal pulses.     Heart sounds: Normal heart sounds.  Pulmonary:     Effort: Pulmonary  effort is normal.     Li sounds: Normal Li sounds.  Musculoskeletal:     Cervical back: Normal range of motion.  Skin:    General: Skin is warm.  Neurological:     General: No focal deficit present.     Mental Status: Jessica Li is alert Jessica oriented to person, place, Jessica time.  Psychiatric:        Mood Jessica Affect: Mood normal.        Behavior: Behavior normal.        Thought Content: Thought content normal.        Judgment: Judgment normal.     Jessica 10-year ASCVD risk score (Arnett DK, et al., 2019) is: 1.8%    Assessment & Plan:   Problem List Items Addressed This Visit       Cardiovascular Jessica Mediastinum   Li associated with diabetes (HCC) - Primary   Chronic, Li. Jessica Li Li is managed on Lisinopril  10 mg. Check CMP, CBC today.       Relevant Medications   lisinopril  (ZESTRIL ) 10 MG tablet   metFORMIN  (GLUCOPHAGE ) 1000 MG tablet   Other Relevant Orders   CBC with Differential/Platelet   Comprehensive metabolic panel with GFR  Endocrine   Uncontrolled type 2 diabetes mellitus with hyperglycemia (HCC)   Chronic, ongoing. Continue metformin  1,000mg  BID, Li  2mg  weekly, Jessica semglee  Li  30 units daily. Jessica Li has not needed humalog . Foot Li UTD. Check CMP, CBC, A1c today. Follow-up in 3 months. Recommend Jessica Li schedule yearly eye Li.       Relevant Medications   lisinopril  (ZESTRIL ) 10 MG tablet   metFORMIN  (GLUCOPHAGE ) 1000 MG tablet   Other Relevant Orders   CBC with Differential/Platelet   Comprehensive metabolic panel with GFR   Hemoglobin A1c     Other   Long term current use of oral hypoglycemic drug   Continue metformin  1,000mg  BID      Long-term current use of injectable noninsulin antidiabetic medication   Continue Li  2mg  injection weekly.       Anemia   Last hemoglobin was 8.1. Ferritin 6.9. Jessica Li has received 2 iron transfusion with hematology Jessica is getting Li injections monthly. Check CBC, iron panel, Jessica Li  today.       Relevant Orders   CBC with Differential/Platelet   Iron, TIBC Jessica Ferritin Panel   Vitamin Li    Return in about 3 months (around 07/13/2024) for Diabetes, HTN.    Tinnie DELENA Harada, NP  I,Emily Lagle,acting as a scribe for [employer], NP.,have documented all relevant documentation on Jessica behalf of [name] DELENA Harada, NP.  I, Tinnie DELENA Harada, NP, have reviewed all documentation for this visit. Jessica Li, Jessica Li, Jessica Li, Jessica Li. "

## 2024-04-14 NOTE — Patient Instructions (Signed)
 It was great to see you!  We are checking your labs today and will let you know the results via mychart/phone.   I will forward the results to Dr. Lonn   I recommend you get a yearly eye exam  Let's follow-up in 3 months, sooner if you have concerns.  If a referral was placed today, you will be contacted for an appointment. Please note that routine referrals can sometimes take up to 3-4 weeks to process. Please call our office if you haven't heard anything after this time frame.  Take care,  Tinnie Harada, NP

## 2024-04-14 NOTE — Assessment & Plan Note (Addendum)
 Chronic, ongoing. Continue metformin  1,000mg  BID, ozempic  2mg  weekly, and semglee  insulin  30 units daily. She has not needed humalog . Foot exam UTD. Check CMP, CBC, A1c today. Follow-up in 3 months. Recommend she schedule yearly eye exam.

## 2024-04-14 NOTE — Assessment & Plan Note (Addendum)
 Chronic, stable. Her hypertension is managed on Lisinopril  10 mg. Check CMP, CBC today.

## 2024-04-14 NOTE — Assessment & Plan Note (Signed)
 Continue ozempic 2mg  injection weekly.

## 2024-04-15 ENCOUNTER — Ambulatory Visit: Payer: Self-pay | Admitting: Nurse Practitioner

## 2024-04-15 LAB — IRON,TIBC AND FERRITIN PANEL
%SAT: 6 % — ABNORMAL LOW (ref 16–45)
Ferritin: 8 ng/mL — ABNORMAL LOW (ref 16–232)
Iron: 21 ug/dL — ABNORMAL LOW (ref 40–190)
TIBC: 342 ug/dL (ref 250–450)

## 2024-04-16 ENCOUNTER — Telehealth: Payer: Self-pay

## 2024-04-16 NOTE — Telephone Encounter (Signed)
Called and left a message asking her to call the office back to schedule appt with Dr. Gorsuch. 

## 2024-04-16 NOTE — Telephone Encounter (Signed)
-----   Message from Almarie Bedford, MD sent at 04/16/2024 12:12 PM EST ----- Pls schedule 20 mins appt only on 2/2 to discuss results

## 2024-04-16 NOTE — Telephone Encounter (Signed)
 She called back. Appt scheduled on 2/2, she is aware.

## 2024-04-24 ENCOUNTER — Other Ambulatory Visit: Payer: Self-pay | Admitting: Nurse Practitioner

## 2024-04-27 ENCOUNTER — Other Ambulatory Visit: Payer: Self-pay | Admitting: Nurse Practitioner

## 2024-04-28 NOTE — Telephone Encounter (Signed)
 Requesting: Ozempic  (2 MG/DOSE) 8 MG/3ML Subcutaneous Solution Pen-injector  Last Visit: 04/14/2024 Next Visit: Visit date not found Last Refill: 03/24/2024  Please Advise

## 2024-05-03 ENCOUNTER — Inpatient Hospital Stay: Admitting: Hematology and Oncology

## 2024-05-07 ENCOUNTER — Other Ambulatory Visit (HOSPITAL_COMMUNITY): Payer: Self-pay | Admitting: Hematology and Oncology

## 2024-05-07 ENCOUNTER — Inpatient Hospital Stay: Admitting: Hematology and Oncology

## 2024-05-07 DIAGNOSIS — D509 Iron deficiency anemia, unspecified: Secondary | ICD-10-CM

## 2024-05-07 DIAGNOSIS — E538 Deficiency of other specified B group vitamins: Secondary | ICD-10-CM

## 2024-05-07 NOTE — Assessment & Plan Note (Addendum)
 The most likely cause of her anemia is due to chronic blood loss/malabsorption syndrome. We discussed some of the risks, benefits, and alternatives of intravenous iron infusions. The patient is symptomatic from anemia and the iron level is critically low. She tolerated oral iron supplement poorly and desires to achieved higher levels of iron faster for adequate hematopoesis. Some of the side-effects to be expected including risks of infusion reactions, phlebitis, headaches, nausea and fatigue.  The patient is willing to proceed. Patient education material was dispensed.  Goal is to keep ferritin level greater than 50 and resolution of anemia  I will order 2 doses of intravenous iron Feraheme  She has frequent appointment to see her primary care doctor for diabetes check and I recommend repeat iron studies in 3 months She will notify me of test results

## 2024-05-07 NOTE — Progress Notes (Signed)
" ° °  [facility] OFFICE PROGRESS NOTE  McElwee, Tinnie LABOR, NP  ASSESSMENT & PLAN:  Assessment & Plan Iron deficiency anemia, unspecified iron deficiency anemia type The most likely cause of her anemia is due to chronic blood loss/malabsorption syndrome. We discussed some of the risks, benefits, and alternatives of intravenous iron infusions. The patient is symptomatic from anemia and the iron level is critically low. She tolerated oral iron supplement poorly and desires to achieved higher levels of iron faster for adequate hematopoesis. Some of the side-effects to be expected including risks of infusion reactions, phlebitis, headaches, nausea and fatigue.  The patient is willing to proceed. Patient education material was dispensed.  Goal is to keep ferritin level greater than 50 and resolution of anemia  I will order 2 doses of intravenous iron Feraheme  She has frequent appointment to see her primary care doctor for diabetes check and I recommend repeat iron studies in 3 months She will notify me of test results Vitamin B12 deficiency Recent repeat vitamin B12 level was adequate She will continue vitamin B12 injection    No orders of the defined types were placed in this encounter.   INTERVAL HISTORY: Patient returns for recurrent anemia Symptoms of anemia includes fatigue We reviewed recent test results from January  SUMMARY OF HEMATOLOGIC HISTORY:  She was found to have abnormal CBC from recent blood work I have the opportunity to review his CBC dated back to 2011 And December 26, 2009, her hemoglobin was normal at 12.5 In 2023, she was noted to be borderline anemic with hemoglobin of 10.7 On January 26, 2022, hemoglobin was 11.7 On Aug 13, 2022, hemoglobin was 12.1 On January 23, 2023, hemoglobin was 9.8, with elevated platelet count of 558 On December 01, 2023, hemoglobin was 9.5 with low MCV and high platelet count of 630 On December 30, 2023, hemoglobin was  8.1, MCV 63.6 and platelet count of 661 Her recent iron studies were grossly abnormal Ferritin was 6.9 Vitamin B12 level on April 1 was also low at 191  She denies recent chest pain on exertion, but has occasional shortness of breath on minimal exertion Denies dizziness She has frequent palpitation She had not noticed any recent bleeding such as epistaxis, hematuria or hematochezia The patient denies over the counter NSAID ingestion. She is not on antiplatelets agents. She has never had colonoscopy done She had no prior history or diagnosis of cancer. Her age appropriate screening programs are up-to-date. She denies any pica and eats a variety of diet, although since she started taking weight loss medicine, her appetite has changed.  She frequently missed breakfast.  Her lunch consists mainly on salad She eats very little meat She never donated blood or received blood transfusion She has severe chronic constipation.  Her bowel habits is usually once or twice a week She cannot tolerate oral iron supplement due to frequent abdominal pain and cramping She received 2 doses of intravenous iron Feraheme  in October 2025 with improvement.  She was also started on vitamin B12 injection monthly with improvement   Lab Results  Component Value Date   VITAMINB12 428 04/14/2024   FERRITIN 8 (L) 04/14/2024   HGB 10.2 (L) 04/14/2024   RBC 4.00 04/14/2024   There were no vitals filed for this visit. "

## 2024-05-07 NOTE — Assessment & Plan Note (Addendum)
 Recent repeat vitamin B12 level was adequate She will continue vitamin B12 injection
